# Patient Record
Sex: Male | Born: 1935 | Race: Asian | Hispanic: No | Marital: Married | Smoking: Former smoker
Health system: Southern US, Community
[De-identification: ages and names within clinical notes are randomized; demographics above are authoritative.]

## PROBLEM LIST (undated history)

## (undated) DIAGNOSIS — F0390 Unspecified dementia without behavioral disturbance: Secondary | ICD-10-CM

## (undated) DIAGNOSIS — E119 Type 2 diabetes mellitus without complications: Secondary | ICD-10-CM

## (undated) DIAGNOSIS — I10 Essential (primary) hypertension: Secondary | ICD-10-CM

## (undated) DIAGNOSIS — D320 Benign neoplasm of cerebral meninges: Secondary | ICD-10-CM

## (undated) DIAGNOSIS — E7800 Pure hypercholesterolemia, unspecified: Secondary | ICD-10-CM

## (undated) DIAGNOSIS — G960 Cerebrospinal fluid leak: Secondary | ICD-10-CM

## (undated) DIAGNOSIS — D329 Benign neoplasm of meninges, unspecified: Secondary | ICD-10-CM

## (undated) DIAGNOSIS — G9608 Other cranial cerebrospinal fluid leak: Secondary | ICD-10-CM

## (undated) HISTORY — DX: Benign neoplasm of meninges, unspecified: D32.9

## (undated) HISTORY — PX: GASTROSTOMY TUBE PLACEMENT: SHX655

## (undated) HISTORY — PX: APPENDECTOMY: SHX54

---

## 2012-08-25 ENCOUNTER — Ambulatory Visit
Admission: RE | Admit: 2012-08-25 | Discharge: 2012-08-25 | Disposition: A | Discharge status: Home / Self Care | Payer: Medicare Other | Source: Ambulatory Visit | Attending: Internal Medicine | Admitting: Internal Medicine

## 2012-08-25 ENCOUNTER — Other Ambulatory Visit: Payer: Self-pay | Admitting: Internal Medicine

## 2012-08-25 DIAGNOSIS — R29898 Other symptoms and signs involving the musculoskeletal system: Secondary | ICD-10-CM

## 2012-08-25 DIAGNOSIS — R413 Other amnesia: Secondary | ICD-10-CM

## 2012-08-25 MED ORDER — IOHEXOL 300 MG/ML  SOLN
75.0000 mL | Freq: Once | INTRAMUSCULAR | Status: AC | PRN
  Administered 2012-08-25: 75 mL via INTRAVENOUS

## 2012-08-26 ENCOUNTER — Inpatient Hospital Stay (HOSPITAL_COMMUNITY): Payer: Medicare Other

## 2012-08-26 ENCOUNTER — Inpatient Hospital Stay (HOSPITAL_COMMUNITY)
Admission: EM | Admit: 2012-08-26 | Discharge: 2012-09-03 | DRG: 025 | Disposition: A | Discharge status: Home / Self Care | Payer: Medicare Other | Attending: Neurosurgery | Admitting: Neurosurgery

## 2012-08-26 ENCOUNTER — Encounter (HOSPITAL_COMMUNITY): Payer: Self-pay | Admitting: Emergency Medicine

## 2012-08-26 DIAGNOSIS — D496 Neoplasm of unspecified behavior of brain: Secondary | ICD-10-CM

## 2012-08-26 DIAGNOSIS — D320 Benign neoplasm of cerebral meninges: Principal | ICD-10-CM | POA: Diagnosis present

## 2012-08-26 DIAGNOSIS — R4701 Aphasia: Secondary | ICD-10-CM

## 2012-08-26 DIAGNOSIS — G936 Cerebral edema: Secondary | ICD-10-CM | POA: Diagnosis present

## 2012-08-26 DIAGNOSIS — R531 Weakness: Secondary | ICD-10-CM

## 2012-08-26 DIAGNOSIS — I10 Essential (primary) hypertension: Secondary | ICD-10-CM | POA: Diagnosis present

## 2012-08-26 DIAGNOSIS — R42 Dizziness and giddiness: Secondary | ICD-10-CM

## 2012-08-26 HISTORY — DX: Essential (primary) hypertension: I10

## 2012-08-26 LAB — COMPREHENSIVE METABOLIC PANEL
BUN: 12 mg/dL (ref 6–23)
CO2: 23 mEq/L (ref 19–32)
Chloride: 102 mEq/L (ref 96–112)
Creatinine, Ser: 0.69 mg/dL (ref 0.50–1.35)
GFR calc Af Amer: >90 mL/min (ref >90)
GFR calc non Af Amer: 90 mL/min — ABNORMAL LOW (ref >90)
Glucose, Bld: 143 mg/dL — ABNORMAL HIGH (ref 70–99)
Total Bilirubin: 0.5 mg/dL (ref 0.3–1.2)

## 2012-08-26 LAB — DIFFERENTIAL
Lymphocytes Relative: 10 % — ABNORMAL LOW (ref 12–46)
Lymphs Abs: 0.7 10*3/uL (ref 0.7–4.0)
Neutrophils Relative %: 89 % — ABNORMAL HIGH (ref 43–77)

## 2012-08-26 LAB — APTT: aPTT: 35 seconds (ref 24–37)

## 2012-08-26 LAB — URINALYSIS, ROUTINE W REFLEX MICROSCOPIC
Bilirubin Urine: NEGATIVE
Glucose, UA: NEGATIVE mg/dL
Hgb urine dipstick: NEGATIVE
Ketones, ur: NEGATIVE mg/dL
Leukocytes, UA: NEGATIVE
pH: 7.5 (ref 5.0–8.0)

## 2012-08-26 LAB — CBC WITH DIFFERENTIAL/PLATELET
Basophils Absolute: 0 10*3/uL (ref 0.0–0.1)
Eosinophils Relative: 2 % (ref 0–5)
HCT: 39.1 % (ref 39.0–52.0)
Lymphocytes Relative: 27 % (ref 12–46)
Lymphs Abs: 1.4 10*3/uL (ref 0.7–4.0)
MCV: 89.5 fL (ref 78.0–100.0)
Monocytes Absolute: 0.3 10*3/uL (ref 0.1–1.0)
RDW: 13.1 % (ref 11.5–15.5)
WBC: 5 10*3/uL (ref 4.0–10.5)

## 2012-08-26 LAB — BASIC METABOLIC PANEL
BUN: 13 mg/dL (ref 6–23)
CO2: 27 mEq/L (ref 19–32)
Chloride: 102 mEq/L (ref 96–112)
Creatinine, Ser: 0.71 mg/dL (ref 0.50–1.35)

## 2012-08-26 LAB — CBC
Platelets: 270 10*3/uL (ref 150–400)
RBC: 4.42 MIL/uL (ref 4.22–5.81)
WBC: 6.6 10*3/uL (ref 4.0–10.5)

## 2012-08-26 LAB — PROTIME-INR: Prothrombin Time: 12.8 seconds (ref 11.6–15.2)

## 2012-08-26 MED ORDER — AMLODIPINE BESYLATE 10 MG PO TABS
10.0000 mg | ORAL_TABLET | Freq: Every day | ORAL | Status: DC
  Administered 2012-08-26 – 2012-09-02 (×6): 10 mg via ORAL
  Filled 2012-08-26 (×9): qty 1

## 2012-08-26 MED ORDER — SODIUM CHLORIDE 0.9 % IV SOLN
INTRAVENOUS | Status: DC
  Administered 2012-08-26: 13:00:00 via INTRAVENOUS

## 2012-08-26 MED ORDER — MORPHINE SULFATE 2 MG/ML IJ SOLN
2.0000 mg | INTRAMUSCULAR | Status: DC | PRN
  Filled 2012-08-26: qty 1

## 2012-08-26 MED ORDER — DEXAMETHASONE 4 MG PO TABS
4.0000 mg | ORAL_TABLET | Freq: Four times a day (QID) | ORAL | Status: DC
  Administered 2012-08-26 – 2012-08-27 (×6): 4 mg via ORAL
  Filled 2012-08-26 (×12): qty 1

## 2012-08-26 MED ORDER — LEVETIRACETAM 500 MG PO TABS
500.0000 mg | ORAL_TABLET | Freq: Two times a day (BID) | ORAL | Status: DC
  Administered 2012-08-26 – 2012-08-30 (×9): 500 mg via ORAL
  Filled 2012-08-26 (×11): qty 1

## 2012-08-26 MED ORDER — ACETAMINOPHEN 325 MG PO TABS: 650.0000 mg | ORAL_TABLET | Freq: Four times a day (QID) | ORAL | Status: DC | PRN

## 2012-08-26 MED ORDER — DEXAMETHASONE SODIUM PHOSPHATE 10 MG/ML IJ SOLN
10.0000 mg | Freq: Once | INTRAMUSCULAR | Status: AC
  Administered 2012-08-26: 10 mg via INTRAVENOUS
  Filled 2012-08-26: qty 1

## 2012-08-26 MED ORDER — POTASSIUM CHLORIDE IN NACL 20-0.9 MEQ/L-% IV SOLN
INTRAVENOUS | Status: DC
  Administered 2012-08-26 – 2012-08-27 (×3): via INTRAVENOUS
  Filled 2012-08-26 (×4): qty 1000

## 2012-08-26 MED ORDER — HYDROCODONE-ACETAMINOPHEN 5-325 MG PO TABS
1.0000 | ORAL_TABLET | ORAL | Status: DC | PRN
  Administered 2012-08-29: 2 via ORAL
  Administered 2012-08-31: 1 via ORAL
  Filled 2012-08-26 (×2): qty 1
  Filled 2012-08-26: qty 2

## 2012-08-26 MED ORDER — LISINOPRIL 40 MG PO TABS
40.0000 mg | ORAL_TABLET | Freq: Every day | ORAL | Status: DC
  Administered 2012-08-26 – 2012-09-02 (×6): 40 mg via ORAL
  Filled 2012-08-26 (×9): qty 1

## 2012-08-26 MED ORDER — GADOBENATE DIMEGLUMINE 529 MG/ML IV SOLN
15.0000 mL | Freq: Once | INTRAVENOUS | Status: AC
  Administered 2012-08-26: 15 mL via INTRAVENOUS

## 2012-08-26 MED ORDER — ACETAMINOPHEN 650 MG RE SUPP: 650.0000 mg | Freq: Four times a day (QID) | RECTAL | Status: DC | PRN

## 2012-08-26 NOTE — ED Notes (Signed)
Confirmed current medications with pastor/translator at bedside. Patient unsure of when the last dose taken.

## 2012-08-26 NOTE — ED Notes (Signed)
Patient in room A 3 with pastor/translator (korean), patient states left sided weakness for approx 2months. NAD. Patient able to respond to translator well

## 2012-08-26 NOTE — ED Notes (Signed)
Dizziness x 2 months and facial  droop left sided weakness

## 2012-08-26 NOTE — ED Notes (Signed)
Was sent to dr and had ct scan yesterday they found tumor

## 2012-08-26 NOTE — H&P (Signed)
William [name] is an 78 y.o. male.   Chief Complaint: Brain tumor HPI: 78 year old gentleman, was in his usual state of health until approximately 2 months ago when the family reports he has been dizzy and weak on his left side. He had a CT yesterday which revealed a large mass originating in the right middle fossa with surrounding edema and right to left shift. He was instructed by his primary physician to come to the emergency room for admission. He nor his wife speak english. I spoke to both his son and pastor and they acted as interpreters. The son wished to tell his father about the mass.   Past Medical History  Diagnosis Date  . Hypertension     No past surgical history on file.  No family history on file. Social History:  reports that he has never smoked. He does not have any smokeless tobacco history on file. He reports that he does not drink alcohol. His drug history not on file.  Allergies: No Known Allergies   (Not in a hospital admission)  Results for orders placed during the hospital encounter of 08/26/12 (from the past 48 hour(s))  BASIC METABOLIC PANEL     Status: Abnormal   Collection Time   08/26/12  1:03 PM      Component Value Range Comment   Sodium 137  135 - 145 mEq/L    Potassium 4.1  3.5 - 5.1 mEq/L    Chloride 102  96 - 112 mEq/L    CO2 27  19 - 32 mEq/L    Glucose, Bld 113 (*) 70 - 99 mg/dL    BUN 13  6 - 23 mg/dL    Creatinine, Ser 1.61  0.50 - 1.35 mg/dL    Calcium 09.6  8.4 - 10.5 mg/dL    GFR calc non Af Amer 89 (*) >90 mL/min    GFR calc Af Amer >90  >90 mL/min   CBC WITH DIFFERENTIAL     Status: Normal   Collection Time   08/26/12  1:03 PM      Component Value Range Comment   WBC 5.0  4.0 - 10.5 K/uL    RBC 4.37  4.22 - 5.81 MIL/uL    Hemoglobin 13.7  13.0 - 17.0 g/dL    HCT 04.5  40.9 - 81.1 %    MCV 89.5  78.0 - 100.0 fL    MCH 31.4  26.0 - 34.0 pg    MCHC 35.0  30.0 - 36.0 g/dL    RDW 91.4  78.2 - 95.6 %    Platelets 250  150 - 400 K/uL    Neutrophils Relative 66  43 - 77 %    Neutro Abs 3.3  1.7 - 7.7 K/uL    Lymphocytes Relative 27  12 - 46 %    Lymphs Abs 1.4  0.7 - 4.0 K/uL    Monocytes Relative 5  3 - 12 %    Monocytes Absolute 0.3  0.1 - 1.0 K/uL    Eosinophils Relative 2  0 - 5 %    Eosinophils Absolute 0.1  0.0 - 0.7 K/uL    Basophils Relative 0  0 - 1 %    Basophils Absolute 0.0  0.0 - 0.1 K/uL    Ct Head W Wo Contrast  08/25/2012  *RADIOLOGY REPORT*  Clinical Data: Left-sided weakness with facial droop and tremor. Vertigo.  CT HEAD WITHOUT AND WITH CONTRAST  Technique:  Contiguous axial images were  obtained from the base of the skull through the vertex without and with intravenous contrast.  Contrast: 75mL OMNIPAQUE IOHEXOL 300 MG/ML  SOLN  Comparison: None.  Findings: There is a lobulated 44 x 52 mm intra-axial lesion involving the right temporal lobe spreading to the uncinate fasciculus into the inferior right frontal lobe and medially towards the insular cortex. The lesion is roughly isodense with cerebral cortex precontrast and displays mild post contrast enhancement without significant central necrosis.  There is a significant amount of surrounding edema which affects the basal ganglia, internal capsule, and extends posteriorly toward the parietal lobe.  There is early right uncal herniation and moderate to right-to-left shift of 6 mm.  The right lateral ventricle is effaced.  No other similar lesions are seen.  Calvarium is intact. Moderate vascular calcification is noted.  The brain appears otherwise normal.  Sinuses and mastoids are clear.  IMPRESSION: 44 x 52 mm solitary right temporal intra-axial lesion displaying moderate post contrast enhancement. 6 mm right-to-left shift with early uncal herniation.  Concern is raised for primary brain tumor although a solitary metastasis is not excluded.  Neurosurgical consultation is warranted.   Original Report Authenticated By: Elsie Stain, M.D.     Review of Systems    Eyes: Negative.   Respiratory: Negative.   Cardiovascular: Negative.   Gastrointestinal: Negative.   Genitourinary: Negative.   Musculoskeletal: Negative.   Skin: Negative.   Neurological: Positive for weakness and headaches.       Forgetful  Endo/Heme/Allergies: Negative.   Psychiatric/Behavioral: Negative.     Blood pressure 129/58, pulse 60, temperature 98 F (36.7 C), temperature source Oral, resp. rate 14, SpO2 99.00%. Physical Exam  Constitutional: He is oriented to person, place, and time. He appears well-developed and well-nourished.  HENT:  Head: Normocephalic and atraumatic.  Right Ear: External ear normal.  Left Ear: External ear normal.  Nose: Nose normal.  Mouth/Throat: Oropharynx is clear and moist.  Eyes: Conjunctivae normal and EOM are normal. Pupils are equal, round, and reactive to light.  Neck: Normal range of motion. Neck supple.  Cardiovascular: Normal rate, regular rhythm, normal heart sounds and intact distal pulses.   Respiratory: Effort normal and breath sounds normal.  GI: Soft. Bowel sounds are normal.  Musculoskeletal: Normal range of motion.  Neurological: He is alert and oriented to person, place, and time. He has normal reflexes. He is not disoriented. He displays normal reflexes. No cranial nerve deficit or sensory deficit. He exhibits normal muscle tone. He displays no seizure activity.       Reports left sided weakness, appears to have full strength on manual exam  Skin: Skin is warm and dry.  Psychiatric: He has a normal mood and affect. His behavior is normal.     Assessment/Plan 78 yo with a large right temporal mass causing mass effect. He is to be admitted for eventual operative resection. He will receive decadron and undergo an MRI. He will be admitted to the NICU.  Actually his exam is quite good. Though the family reports he has been weak on the left side his exam is very good.   [name] 08/26/2012, 2:03 PM

## 2012-08-26 NOTE — ED Notes (Signed)
MD at bedside. Neurosurgery

## 2012-08-26 NOTE — ED Provider Notes (Signed)
History     CSN: [id number]  Arrival date & time 08/26/12  1112   First MD Initiated Contact with Patient 08/26/12 1238      No chief complaint on file.   (Consider location/radiation/quality/duration/timing/severity/associated sxs/prior treatment) HPI Comments: William [name] is a 78 y.o. Male with dizziness, and confusion, worsening for 2 months. Now weakness of left arm and tremor for 2 days.No N/V, fever or chills. His PCP ordered the CT, and he was told to come last night. No modifying factors.  The history is provided by the patient. A language interpreter was used (The patient's pastor).    Past Medical History  Diagnosis Date  . Hypertension     No past surgical history on file.  No family history on file.  History  Substance Use Topics  . Smoking status: Never Smoker   . Smokeless tobacco: Not on file  . Alcohol Use: No      Review of Systems  All other systems reviewed and are negative.    Allergies  Review of patient's allergies indicates no known allergies.  Home Medications  No current outpatient prescriptions on file.  BP 106/55  Pulse 57  Temp 97.7 F (36.5 C) (Oral)  Resp 20  Ht 5\' 2"  (1.575 m)  Wt 139 lb 8.8 oz (63.3 kg)  BMI 25.52 kg/m2  SpO2 95%  Physical Exam  Nursing note and vitals reviewed. Constitutional: He is oriented to person, place, and time. He appears well-developed and well-nourished.  HENT:  Head: Normocephalic and atraumatic.  Right Ear: External ear normal.  Left Ear: External ear normal.  Eyes: Conjunctivae normal and EOM are normal. Pupils are equal, round, and reactive to light.  Neck: Normal range of motion and phonation normal. Neck supple.  Cardiovascular: Normal rate, regular rhythm, normal heart sounds and intact distal pulses.   Pulmonary/Chest: Effort normal and breath sounds normal. He exhibits no bony tenderness.  Abdominal: Soft. Normal appearance. There is no tenderness.  Musculoskeletal: Normal range of  motion.  Neurological: He is alert and oriented to person, place, and time. He has normal strength. No cranial nerve deficit or sensory deficit. He exhibits normal muscle tone. Coordination (abnormal finger to nose, R>L. ) abnormal.       Normal grip strength arms and legs bilaterally. No facial asymmetry.  Skin: Skin is warm, dry and intact.  Psychiatric: He has a normal mood and affect. His behavior is normal. Judgment and thought content normal.    ED Course  Procedures (including critical care time) ED Treatment by me- IV Decadron  I called Dr Jeral Fruit, he did not answer; Dr Mikal Plane came to the ED to see and admit the patient.    Labs Reviewed  BASIC METABOLIC PANEL - Abnormal; Notable for the following:    Glucose, Bld 113 (*)     GFR calc non Af Amer 89 (*)     All other components within normal limits  COMPREHENSIVE METABOLIC PANEL - Abnormal; Notable for the following:    Glucose, Bld 143 (*)     GFR calc non Af Amer 90 (*)     All other components within normal limits  DIFFERENTIAL - Abnormal; Notable for the following:    Neutrophils Relative 89 (*)     Lymphocytes Relative 10 (*)     Monocytes Relative 1 (*)     Monocytes Absolute 0.0 (*)     All other components within normal limits  URINALYSIS, ROUTINE W REFLEX MICROSCOPIC  CBC WITH  DIFFERENTIAL  MRSA PCR SCREENING  CBC  APTT  PROTIME-INR   X-ray Chest Pa And Lateral   08/26/2012  *RADIOLOGY REPORT*  Clinical Data: Preoperative chest radiograph.  CHEST - 2 VIEW  Comparison: No priors.  Findings: Lung volumes are normal.  No consolidative airspace disease.  No pleural effusions.  No pneumothorax.  No pulmonary nodule or mass noted.  Pulmonary vasculature and the cardiomediastinal silhouette are within normal limits. Atherosclerosis in the thoracic aorta.  Prominence of the right first costochondral junction may relate to remote trauma or may be degenerative.  IMPRESSION: 1.  No radiographic evidence of acute  cardiopulmonary disease. 2.  Atherosclerosis.   Original Report Authenticated By: Florencia Reasons, M.D.    Ct Head W Wo Contrast  08/25/2012  *RADIOLOGY REPORT*  Clinical Data: Left-sided weakness with facial droop and tremor. Vertigo.  CT HEAD WITHOUT AND WITH CONTRAST  Technique:  Contiguous axial images were obtained from the base of the skull through the vertex without and with intravenous contrast.  Contrast: 75mL OMNIPAQUE IOHEXOL 300 MG/ML  SOLN  Comparison: None.  Findings: There is a lobulated 44 x 52 mm intra-axial lesion involving the right temporal lobe spreading to the uncinate fasciculus into the inferior right frontal lobe and medially towards the insular cortex. The lesion is roughly isodense with cerebral cortex precontrast and displays mild post contrast enhancement without significant central necrosis.  There is a significant amount of surrounding edema which affects the basal ganglia, internal capsule, and extends posteriorly toward the parietal lobe.  There is early right uncal herniation and moderate to right-to-left shift of 6 mm.  The right lateral ventricle is effaced.  No other similar lesions are seen.  Calvarium is intact. Moderate vascular calcification is noted.  The brain appears otherwise normal.  Sinuses and mastoids are clear.  IMPRESSION: 44 x 52 mm solitary right temporal intra-axial lesion displaying moderate post contrast enhancement. 6 mm right-to-left shift with early uncal herniation.  Concern is raised for primary brain tumor although a solitary metastasis is not excluded.  Neurosurgical consultation is warranted.   Original Report Authenticated By: Elsie Stain, M.D.    Mr Laqueta Jean Wo Contrast  08/26/2012  *RADIOLOGY REPORT*  Clinical Data: Left-sided weakness and dizziness.  Mass lesion.  MRI HEAD WITHOUT AND WITH CONTRAST  Technique:  Multiplanar, multiecho pulse sequences of the brain and surrounding structures were obtained according to standard protocol  without and with intravenous contrast  Contrast: 15mL MULTIHANCE GADOBENATE DIMEGLUMINE 529 MG/ML IV SOLN  Comparison: CT head with and without contrast 08/25/2012.  Findings: A 4.4 x 7.0 x 4.7 cm right temporal fossa mass demonstrates a CSF cleft around the tumor, compatible with an extra- axial tumor.  There is also restricted effusion in the tumor, compatible with a meningioma.  Enhancement is mildly heterogeneous. The tumor extends to the lateral margin of the cavernous sinus without invasion of the cavernous sinus or orbital apex.  There is marked displacement of the right temporal lobe inferior frontal lobe with significant surrounding vasogenic edema.  Midline shift of the mamillary bodies is 11 mm.  There is effacement of the right lateral ventricle.  There is no hydrocephalus.  Mild periventricular white matter changes are present.  Sulcal effacement is noted throughout the right hemisphere.  No other focal mass lesion is present.  There is no hemorrhage.  Flow is present in the major intracranial arteries.  The right middle cerebral artery extends around the tumor.  The patient is  status post bilateral lens extractions.  The paranasal sinuses and mastoid air cells are clear.  IMPRESSION:  1.  4.4 x 7.0 x 4.7 cm right temporal fossa mass appears to be extra-axial and is most compatible with a meningioma. 2.  Significant right-sided mass effect with sulcal effacement and extensive vasogenic edema. 3.  11 mm of midline shift.   Original Report Authenticated By: Jamesetta Orleans. MATTERN, M.D.      1. Brain tumor       MDM  Symptomatic Brain tumor. Pt needs urgent Neurosurgical consultation.   Disposition per Dr Mikal Plane     Flint Melter, MD 08/26/12 2124

## 2012-08-26 NOTE — Progress Notes (Signed)
Mri reviewed. This is almost certainly a meningioma. Spoke to family tonight. Will arrange a surgical date, and preoperative embolization.

## 2012-08-27 ENCOUNTER — Other Ambulatory Visit: Payer: Self-pay | Admitting: Neurosurgery

## 2012-08-27 ENCOUNTER — Telehealth (HOSPITAL_COMMUNITY): Payer: Self-pay

## 2012-08-27 NOTE — Progress Notes (Signed)
Patient ID: William [name], male   DOB: June 12, 1934, 78 y.o.   MRN: [id number] BP 114/56  Pulse 58  Temp 98.6 F (37 C) (Oral)  Resp 17  Ht 5\' 2"  (1.575 m)  Wt 63.3 kg (139 lb 8.8 oz)  BMI 25.52 kg/m2  SpO2 96% Alert and oriented Follows commands Moving all extremities Case scheduled for Monday, after embolization.

## 2012-08-27 NOTE — Telephone Encounter (Signed)
Dr. Corliss Skains wants the priest and the son to be here Monday for the procedure

## 2012-08-28 MED ORDER — SODIUM CHLORIDE 0.9 % IV SOLN: INTRAVENOUS | Status: DC

## 2012-08-28 MED ORDER — POTASSIUM CHLORIDE IN NACL 20-0.9 MEQ/L-% IV SOLN
INTRAVENOUS | Status: DC
  Administered 2012-08-28 – 2012-08-30 (×2): 20 mL/h via INTRAVENOUS
  Filled 2012-08-28 (×2): qty 1000

## 2012-08-28 MED ORDER — NIMODIPINE 30 MG PO CAPS
60.0000 mg | ORAL_CAPSULE | ORAL | Status: DC
  Filled 2012-08-28: qty 2

## 2012-08-28 MED ORDER — DEXAMETHASONE 4 MG PO TABS
4.0000 mg | ORAL_TABLET | Freq: Three times a day (TID) | ORAL | Status: DC
  Administered 2012-08-28 – 2012-08-31 (×10): 4 mg via ORAL
  Filled 2012-08-28 (×13): qty 1

## 2012-08-28 MED ORDER — CEFAZOLIN SODIUM-DEXTROSE 2-3 GM-% IV SOLR
2.0000 g | Freq: Once | INTRAVENOUS | Status: AC
  Administered 2012-08-31: 2 g via INTRAVENOUS
  Administered 2012-08-31: 1 g via INTRAVENOUS
  Filled 2012-08-28: qty 50

## 2012-08-28 NOTE — Clinical Documentation Improvement (Signed)
Abnormal Results Clarification  THIS DOCUMENT IS NOT A PERMANENT PART OF THE MEDICAL RECORD  Please update your documentation within the medical record to reflect your response to this query.                                                                                   08/28/12  Dear Dr. Franky Macho Marton Redwood  In a better effort to capture your patient's severity of illness, reflect appropriate length of stay and utilization of resources, a review of the medical record has revealed the following indicators.   Based on your clinical judgment, please clarify and document in a progress note and/or discharge summary the clinical condition associated with the following supporting information: In responding to this query please exercise your independent judgment.  The fact that a query is asked, does not imply that any particular answer is desired or expected.    Abnormal findings (laboratory, x-ray, pathologic, and other diagnostic results) are not coded and reported unless the physician indicates their clinical significance.   The medical record reflects the following clinical findings, please clarify the diagnostic and/or clinical significance:      MRI Brain 10/09 :1. 4.4 x 7.0 x 4.7 cm right temporal fossa mass appears to be  extra-axial and is most compatible with a meningioma.  2. Significant right-sided mass effect with sulcal effacement and  extensive vasogenic edema.  3. 11 mm of midline shift.  Possible Clinical Conditions?  - Vasogenic Edema  - Cerebral Edema  - Other condition (please document in the progress notes and/or discharge summary)  - Cannot Clinically determine at this time    Supporting Information:  - dexamethasone (DECADRON) tablet 4 mg ordered 08/28/12     Reviewed: additional documentation in the medical record   Thank Darden Palmer  Clinical Documentation Specialist: [phone] Pager  Health Information Management [facility]

## 2012-08-28 NOTE — Progress Notes (Signed)
Subjective: Interval History: has complaints left sided weakness.  Objective: Vital signs in last 24 hours: Temp:  [97.5 F (36.4 C)-98 F (36.7 C)] 97.6 F (36.4 C) (10/11 1345) Pulse Rate:  [49-61] 61  (10/11 1345) Resp:  [12-21] 18  (10/11 1345) BP: (95-122)/(50-60) 115/54 mmHg (10/11 1345) SpO2:  [95 %-98 %] 98 % (10/11 1345) Weight:  [69.2 kg (152 lb 8.9 oz)] 69.2 kg (152 lb 8.9 oz) (10/11 0541)  Intake/Output from previous day: 10/10 0701 - 10/11 0700 In: 1200 [I.V.:1200] Out: 300 [Urine:300] Intake/Output this shift: Total I/O In: -  Out: 250 [Urine:250]  Alert and oriented, follows commands Perrl, full eom Symmetric facies, tongue and uvula midline Moving all extremities  No results found for this or any previous visit (from the past 24 hour(s)).  Studies/Results: X-ray Chest Pa And Lateral   08/26/2012  *RADIOLOGY REPORT*  Clinical Data: Preoperative chest radiograph.  CHEST - 2 VIEW  Comparison: No priors.  Findings: Lung volumes are normal.  No consolidative airspace disease.  No pleural effusions.  No pneumothorax.  No pulmonary nodule or mass noted.  Pulmonary vasculature and the cardiomediastinal silhouette are within normal limits. Atherosclerosis in the thoracic aorta.  Prominence of the right first costochondral junction may relate to remote trauma or may be degenerative.  IMPRESSION: 1.  No radiographic evidence of acute cardiopulmonary disease. 2.  Atherosclerosis.   Original Report Authenticated By: Florencia Reasons, M.D.    Ct Head W Wo Contrast  08/25/2012  *RADIOLOGY REPORT*  Clinical Data: Left-sided weakness with facial droop and tremor. Vertigo.  CT HEAD WITHOUT AND WITH CONTRAST  Technique:  Contiguous axial images were obtained from the base of the skull through the vertex without and with intravenous contrast.  Contrast: 75mL OMNIPAQUE IOHEXOL 300 MG/ML  SOLN  Comparison: None.  Findings: There is a lobulated 44 x 52 mm intra-axial lesion involving  the right temporal lobe spreading to the uncinate fasciculus into the inferior right frontal lobe and medially towards the insular cortex. The lesion is roughly isodense with cerebral cortex precontrast and displays mild post contrast enhancement without significant central necrosis.  There is a significant amount of surrounding edema which affects the basal ganglia, internal capsule, and extends posteriorly toward the parietal lobe.  There is early right uncal herniation and moderate to right-to-left shift of 6 mm.  The right lateral ventricle is effaced.  No other similar lesions are seen.  Calvarium is intact. Moderate vascular calcification is noted.  The brain appears otherwise normal.  Sinuses and mastoids are clear.  IMPRESSION: 44 x 52 mm solitary right temporal intra-axial lesion displaying moderate post contrast enhancement. 6 mm right-to-left shift with early uncal herniation.  Concern is raised for primary brain tumor although a solitary metastasis is not excluded.  Neurosurgical consultation is warranted.   Original Report Authenticated By: Elsie Stain, M.D.    Mr Laqueta Jean Wo Contrast  08/26/2012  *RADIOLOGY REPORT*  Clinical Data: Left-sided weakness and dizziness.  Mass lesion.  MRI HEAD WITHOUT AND WITH CONTRAST  Technique:  Multiplanar, multiecho pulse sequences of the brain and surrounding structures were obtained according to standard protocol without and with intravenous contrast  Contrast: 15mL MULTIHANCE GADOBENATE DIMEGLUMINE 529 MG/ML IV SOLN  Comparison: CT head with and without contrast 08/25/2012.  Findings: A 4.4 x 7.0 x 4.7 cm right temporal fossa mass demonstrates a CSF cleft around the tumor, compatible with an extra- axial tumor.  There is also restricted effusion in the  tumor, compatible with a meningioma.  Enhancement is mildly heterogeneous. The tumor extends to the lateral margin of the cavernous sinus without invasion of the cavernous sinus or orbital apex.  There is marked  displacement of the right temporal lobe inferior frontal lobe with significant surrounding vasogenic edema.  Midline shift of the mamillary bodies is 11 mm.  There is effacement of the right lateral ventricle.  There is no hydrocephalus.  Mild periventricular white matter changes are present.  Sulcal effacement is noted throughout the right hemisphere.  No other focal mass lesion is present.  There is no hemorrhage.  Flow is present in the major intracranial arteries.  The right middle cerebral artery extends around the tumor.  The patient is status post bilateral lens extractions.  The paranasal sinuses and mastoid air cells are clear.  IMPRESSION:  1.  4.4 x 7.0 x 4.7 cm right temporal fossa mass appears to be extra-axial and is most compatible with a meningioma. 2.  Significant right-sided mass effect with sulcal effacement and extensive vasogenic edema. 3.  11 mm of midline shift.   Original Report Authenticated By: Jamesetta Orleans. MATTERN, M.D.     Scheduled Meds:   . amLODipine  10 mg Oral Daily  .  ceFAZolin (ANCEF) IV  2 g Intravenous Once  . dexamethasone  4 mg Oral Q8H  . levETIRAcetam  500 mg Oral BID  . lisinopril  40 mg Oral Daily  . niMODipine  60 mg Oral 60 min Pre-Op  . DISCONTD: dexamethasone  4 mg Oral Q6H   Continuous Infusions:   . sodium chloride    . 0.9 % NaCl with KCl 20 mEq / L 20 mL/hr (08/28/12 1126)  . DISCONTD: 0.9 % NaCl with KCl 20 mEq / L 80 mL/hr at 08/27/12 1816   PRN Meds:acetaminophen, acetaminophen, HYDROcodone-acetaminophen, morphine injection  Assessment/Plan: For resection Monday. Continue decadron for cerebral edema.  Exam is unchanged.     LOS: 2 days   Verdine Grenfell L

## 2012-08-28 NOTE — Progress Notes (Signed)
Co-signed for Meredith Leonard RN/BSN for assessments, IV assessments, Medication administration, I and O's, Care plans, patient educations, and progress notes. Kayliana Codd M,RN/BSN 

## 2012-08-29 NOTE — Progress Notes (Signed)
Patient ID: William [name], male   DOB: Nov 07, 1934, 78 y.o.   MRN: [id number] Subjective:  The patient is alert and pleasant. There is a language barrier.  Objective: Vital signs in last 24 hours: Temp:  [97.4 F (36.3 C)-98.1 F (36.7 C)] 97.4 F (36.3 C) (10/12 0732) Pulse Rate:  [51-61] 58  (10/12 0732) Resp:  [18-20] 18  (10/12 0732) BP: (104-122)/(53-65) 117/58 mmHg (10/12 0732) SpO2:  [95 %-99 %] 95 % (10/12 0732)  Intake/Output from previous day: 10/11 0701 - 10/12 0700 In: -  Out: 250 [Urine:250] Intake/Output this shift:    Physical exam the patient is alert and moving all 4 extremities. His pupils are equal.  Lab Results:  Basename 08/26/12 1636 08/26/12 1303  WBC 6.6 5.0  HGB 13.8 13.7  HCT 39.8 39.1  PLT 270 250   BMET  Basename 08/26/12 1636 08/26/12 1303  NA 136 137  K 4.1 4.1  CL 102 102  CO2 23 27  GLUCOSE 143* 113*  BUN 12 13  CREATININE 0.69 0.71  CALCIUM 10.0 10.0    Studies/Results: No results found.  Assessment/Plan: Brain tumor: Surgery is tentatively plan for Monday.  LOS: 3 days     Janey Petron D 08/29/2012, 9:15 AM

## 2012-08-30 MED ORDER — CEFAZOLIN SODIUM-DEXTROSE 2-3 GM-% IV SOLR
2.0000 g | INTRAVENOUS | Status: DC
  Filled 2012-08-30: qty 50

## 2012-08-30 NOTE — Progress Notes (Signed)
BP 115/62  Pulse 53  Temp 98.1 F (36.7 C) (Oral)  Resp 20  Ht 5\' 2"  (1.575 m)  Wt 152 lb 8.9 oz (69.2 kg)  BMI 27.90 kg/m2  SpO2 98% Request for IR angiogram with embo prior to planned NS procedure Dr. Corliss Skains with planned case for tomorrow. We are awaiting son and 'priest' to interpret to thoroughly discuss case/procedure. Labs ok NPO after MN. Dr. Corliss Skains is available if son/priest are here today and would prefer to meet today as case is at 0800 tomorrow.

## 2012-08-30 NOTE — Progress Notes (Signed)
Subjective: Patient reports doing OK, although language barrier (Korean/English)  Objective: Vital signs in last 24 hours: Temp:  [97.9 F (36.6 C)-98.1 F (36.7 C)] 98.1 F (36.7 C) (10/13 0617) Pulse Rate:  [48-62] 53  (10/13 0617) Resp:  [18-20] 20  (10/13 0617) BP: (108-132)/(54-62) 115/62 mmHg (10/13 0617) SpO2:  [97 %-98 %] 98 % (10/13 0617)  Intake/Output from previous day: 10/12 0701 - 10/13 0700 In: -  Out: 1150 [Urine:1150] Intake/Output this shift:    Physical Exam: Stable.  Walking with wife  Lab Results: No results found for this basename: WBC:2,HGB:2,HCT:2,PLT:2 in the last 72 hours BMET No results found for this basename: NA:2,K:2,CL:2,CO2:2,GLUCOSE:2,BUN:2,CREATININE:2,CALCIUM:2 in the last 72 hours  Studies/Results: No results found.  Assessment/Plan: Surgery in am with Dr. Franky Macho for meningioma.    LOS: 4 days    Dorian Heckle, MD 08/30/2012, 8:49 AM

## 2012-08-31 ENCOUNTER — Encounter (HOSPITAL_COMMUNITY): Payer: Self-pay | Admitting: Certified Registered"

## 2012-08-31 ENCOUNTER — Encounter (HOSPITAL_COMMUNITY)
Admission: EM | Disposition: A | Discharge status: Home / Self Care | Payer: Self-pay | Source: Home / Self Care | Attending: Neurosurgery

## 2012-08-31 ENCOUNTER — Inpatient Hospital Stay (HOSPITAL_COMMUNITY): Payer: Medicare Other | Admitting: Certified Registered"

## 2012-08-31 ENCOUNTER — Inpatient Hospital Stay (HOSPITAL_COMMUNITY): Payer: Medicare Other

## 2012-08-31 HISTORY — PX: CRANIOTOMY: SHX93

## 2012-08-31 LAB — CBC
HCT: 39.2 % (ref 39.0–52.0)
Hemoglobin: 13.7 g/dL (ref 13.0–17.0)
MCHC: 34.9 g/dL (ref 30.0–36.0)

## 2012-08-31 LAB — BASIC METABOLIC PANEL
BUN: 24 mg/dL — ABNORMAL HIGH (ref 6–23)
CO2: 26 mEq/L (ref 19–32)
Chloride: 101 mEq/L (ref 96–112)
GFR calc non Af Amer: 86 mL/min — ABNORMAL LOW (ref >90)
Glucose, Bld: 117 mg/dL — ABNORMAL HIGH (ref 70–99)
Potassium: 4 mEq/L (ref 3.5–5.1)

## 2012-08-31 SURGERY — CRANIOTOMY TUMOR EXCISION
Anesthesia: General | Site: Head | Laterality: Right | Wound class: Clean

## 2012-08-31 SURGERY — RADIOLOGY WITH ANESTHESIA
Anesthesia: General

## 2012-08-31 MED ORDER — MANNITOL 25 % IV SOLN
INTRAVENOUS | Status: DC | PRN
  Administered 2012-08-31: 37.5 g via INTRAVENOUS

## 2012-08-31 MED ORDER — LACTATED RINGERS IV SOLN: INTRAVENOUS | Status: DC | PRN

## 2012-08-31 MED ORDER — SODIUM CHLORIDE 0.9 % IV SOLN
500.0000 mg | Freq: Two times a day (BID) | INTRAVENOUS | Status: DC
  Administered 2012-08-31 – 2012-09-02 (×4): 500 mg via INTRAVENOUS
  Filled 2012-08-31 (×5): qty 5

## 2012-08-31 MED ORDER — SODIUM CHLORIDE 0.9 % IV SOLN
INTRAVENOUS | Status: DC | PRN
  Administered 2012-08-31 (×2): via INTRAVENOUS

## 2012-08-31 MED ORDER — MIDAZOLAM HCL 5 MG/5ML IJ SOLN
INTRAMUSCULAR | Status: DC | PRN
  Administered 2012-08-31: 2 mg via INTRAVENOUS

## 2012-08-31 MED ORDER — ONDANSETRON HCL 4 MG PO TABS: 4.0000 mg | ORAL_TABLET | ORAL | Status: DC | PRN

## 2012-08-31 MED ORDER — BISACODYL 5 MG PO TBEC
5.0000 mg | DELAYED_RELEASE_TABLET | Freq: Every day | ORAL | Status: DC | PRN
  Filled 2012-08-31: qty 1

## 2012-08-31 MED ORDER — FENTANYL CITRATE 0.05 MG/ML IJ SOLN: 25.0000 ug | INTRAMUSCULAR | Status: DC | PRN

## 2012-08-31 MED ORDER — VECURONIUM BROMIDE 10 MG IV SOLR
INTRAVENOUS | Status: DC | PRN
  Administered 2012-08-31: 4 mg via INTRAVENOUS
  Administered 2012-08-31 (×3): 2 mg via INTRAVENOUS

## 2012-08-31 MED ORDER — MAGNESIUM CITRATE PO SOLN
1.0000 | Freq: Once | ORAL | Status: AC | PRN
  Filled 2012-08-31: qty 296

## 2012-08-31 MED ORDER — DEXAMETHASONE SODIUM PHOSPHATE 4 MG/ML IJ SOLN
INTRAMUSCULAR | Status: DC | PRN
  Administered 2012-08-31: 10 mg via INTRAVENOUS

## 2012-08-31 MED ORDER — HEPARIN SODIUM (PORCINE) 1000 UNIT/ML IJ SOLN
INTRAMUSCULAR | Status: DC | PRN
  Administered 2012-08-31 (×3): 1000 [IU] via INTRAVENOUS

## 2012-08-31 MED ORDER — MICROFIBRILLAR COLL HEMOSTAT EX PADS
MEDICATED_PAD | CUTANEOUS | Status: DC | PRN
  Administered 2012-08-31: 1 via TOPICAL

## 2012-08-31 MED ORDER — PROMETHAZINE HCL 25 MG PO TABS: 12.5000 mg | ORAL_TABLET | ORAL | Status: DC | PRN

## 2012-08-31 MED ORDER — GLYCOPYRROLATE 0.2 MG/ML IJ SOLN
INTRAMUSCULAR | Status: DC | PRN
  Administered 2012-08-31: 0.4 mg via INTRAVENOUS

## 2012-08-31 MED ORDER — NEOSTIGMINE METHYLSULFATE 1 MG/ML IJ SOLN
INTRAMUSCULAR | Status: DC | PRN
  Administered 2012-08-31: 3 mg via INTRAVENOUS

## 2012-08-31 MED ORDER — LIDOCAINE HCL (CARDIAC) 20 MG/ML IV SOLN
INTRAVENOUS | Status: DC | PRN
  Administered 2012-08-31: 60 mg via INTRAVENOUS

## 2012-08-31 MED ORDER — SODIUM CHLORIDE 0.9 % IJ SOLN
1.5000 mg | INTRAVENOUS | Status: DC
  Filled 2012-08-31: qty 0.3

## 2012-08-31 MED ORDER — ARTIFICIAL TEARS OP OINT
TOPICAL_OINTMENT | OPHTHALMIC | Status: DC | PRN
  Administered 2012-08-31: 1 via OPHTHALMIC

## 2012-08-31 MED ORDER — THROMBIN 20000 UNITS EX SOLR
CUTANEOUS | Status: DC | PRN
  Administered 2012-08-31: 15:00:00 via TOPICAL

## 2012-08-31 MED ORDER — MICROFIBRILLAR COLL HEMOSTAT EX PADS: MEDICATED_PAD | CUTANEOUS | Status: DC | PRN

## 2012-08-31 MED ORDER — POTASSIUM CHLORIDE IN NACL 20-0.9 MEQ/L-% IV SOLN
INTRAVENOUS | Status: DC
  Administered 2012-08-31 – 2012-09-02 (×3): via INTRAVENOUS
  Filled 2012-08-31 (×5): qty 1000

## 2012-08-31 MED ORDER — ONDANSETRON HCL 4 MG/2ML IJ SOLN: 4.0000 mg | INTRAMUSCULAR | Status: DC | PRN

## 2012-08-31 MED ORDER — SODIUM CHLORIDE 0.9 % IV SOLN
INTRAVENOUS | Status: DC | PRN
  Administered 2012-08-31 (×2): via INTRAVENOUS

## 2012-08-31 MED ORDER — LABETALOL HCL 5 MG/ML IV SOLN: 10.0000 mg | INTRAVENOUS | Status: DC | PRN

## 2012-08-31 MED ORDER — ACETAMINOPHEN 10 MG/ML IV SOLN
1000.0000 mg | Freq: Four times a day (QID) | INTRAVENOUS | Status: AC
  Administered 2012-08-31 – 2012-09-01 (×4): 1000 mg via INTRAVENOUS
  Filled 2012-08-31 (×4): qty 100

## 2012-08-31 MED ORDER — SODIUM CHLORIDE 0.9 % IR SOLN
Status: DC | PRN
  Administered 2012-08-31 (×3): 1000 mL

## 2012-08-31 MED ORDER — ONDANSETRON HCL 4 MG/2ML IJ SOLN
INTRAMUSCULAR | Status: DC | PRN
  Administered 2012-08-31: 4 mg via INTRAVENOUS

## 2012-08-31 MED ORDER — DEXAMETHASONE 6 MG PO TABS
6.0000 mg | ORAL_TABLET | Freq: Four times a day (QID) | ORAL | Status: DC
  Administered 2012-08-31 – 2012-09-03 (×10): 6 mg via ORAL
  Filled 2012-08-31 (×15): qty 1

## 2012-08-31 MED ORDER — ONDANSETRON HCL 4 MG/2ML IJ SOLN: 4.0000 mg | Freq: Four times a day (QID) | INTRAMUSCULAR | Status: DC | PRN

## 2012-08-31 MED ORDER — OXYCODONE HCL 5 MG PO TABS: 5.0000 mg | ORAL_TABLET | Freq: Once | ORAL | Status: DC | PRN

## 2012-08-31 MED ORDER — SODIUM CHLORIDE 0.9 % IV SOLN
10.0000 mg | INTRAVENOUS | Status: DC | PRN
  Administered 2012-08-31: 20 ug/min via INTRAVENOUS

## 2012-08-31 MED ORDER — IOHEXOL 300 MG/ML  SOLN
150.0000 mL | Freq: Once | INTRAMUSCULAR | Status: AC | PRN
  Administered 2012-08-31: 150 mL via INTRA_ARTERIAL

## 2012-08-31 MED ORDER — FENTANYL CITRATE 0.05 MG/ML IJ SOLN
INTRAMUSCULAR | Status: DC | PRN
  Administered 2012-08-31: 50 ug via INTRAVENOUS
  Administered 2012-08-31: 25 ug via INTRAVENOUS
  Administered 2012-08-31: 50 ug via INTRAVENOUS

## 2012-08-31 MED ORDER — OXYCODONE HCL 5 MG/5ML PO SOLN: 5.0000 mg | Freq: Once | ORAL | Status: DC | PRN

## 2012-08-31 MED ORDER — CEFAZOLIN SODIUM 1-5 GM-% IV SOLN
INTRAVENOUS | Status: AC
  Filled 2012-08-31: qty 50

## 2012-08-31 MED ORDER — PROPOFOL 10 MG/ML IV BOLUS
INTRAVENOUS | Status: DC | PRN
  Administered 2012-08-31: 50 mg via INTRAVENOUS
  Administered 2012-08-31: 160 mg via INTRAVENOUS

## 2012-08-31 MED ORDER — PANTOPRAZOLE SODIUM 40 MG IV SOLR
40.0000 mg | Freq: Every day | INTRAVENOUS | Status: DC
  Administered 2012-08-31 – 2012-09-01 (×2): 40 mg via INTRAVENOUS
  Filled 2012-08-31 (×3): qty 40

## 2012-08-31 MED ORDER — ROCURONIUM BROMIDE 100 MG/10ML IV SOLN
INTRAVENOUS | Status: DC | PRN
  Administered 2012-08-31 (×4): 10 mg via INTRAVENOUS
  Administered 2012-08-31: 50 mg via INTRAVENOUS

## 2012-08-31 MED ORDER — BACITRACIN ZINC 500 UNIT/GM EX OINT
TOPICAL_OINTMENT | CUTANEOUS | Status: DC | PRN
  Administered 2012-08-31: 1 via TOPICAL

## 2012-08-31 MED ORDER — EPHEDRINE SULFATE 50 MG/ML IJ SOLN
INTRAMUSCULAR | Status: DC | PRN
  Administered 2012-08-31 (×2): 5 mg via INTRAVENOUS

## 2012-08-31 MED ORDER — LIDOCAINE-EPINEPHRINE 0.5 %-1:200000 IJ SOLN
INTRAMUSCULAR | Status: DC | PRN
  Administered 2012-08-31: 10 mL

## 2012-08-31 SURGICAL SUPPLY — 114 items
BANDAGE GAUZE 4  KLING STR (GAUZE/BANDAGES/DRESSINGS) ×2 IMPLANT
BANDAGE GAUZE ELAST BULKY 4 IN (GAUZE/BANDAGES/DRESSINGS) ×6 IMPLANT
BENZOIN TINCTURE PRP APPL 2/3 (GAUZE/BANDAGES/DRESSINGS) IMPLANT
BLADE SAW GIGLI 16 STRL (MISCELLANEOUS) IMPLANT
BLADE SURG 15 STRL LF DISP TIS (BLADE) IMPLANT
BLADE SURG 15 STRL SS (BLADE)
BLADE SURG ROTATE 9660 (MISCELLANEOUS) ×2 IMPLANT
BLADE ULTRA TIP 2M (BLADE) IMPLANT
BRUSH SCRUB EZ 1% IODOPHOR (MISCELLANEOUS) IMPLANT
BUR ACORN 6.0 PRECISION (BURR) ×2 IMPLANT
BUR ADDG 1.1 (BURR) ×2 IMPLANT
BUR MATCHSTICK NEURO 3.0 LAGG (BURR) ×2 IMPLANT
BUR ROUTER D-58 CRANI (BURR) ×2 IMPLANT
CANISTER SUCTION 2500CC (MISCELLANEOUS) ×4 IMPLANT
CATH VENTRIC 35X38 W/TROCAR LG (CATHETERS) IMPLANT
CLIP RANEY DISP (INSTRUMENTS) ×2 IMPLANT
CLIP TI MEDIUM 6 (CLIP) IMPLANT
CLOTH BEACON ORANGE TIMEOUT ST (SAFETY) ×2 IMPLANT
CONT SPEC 4OZ CLIKSEAL STRL BL (MISCELLANEOUS) ×8 IMPLANT
CORDS BIPOLAR (ELECTRODE) ×2 IMPLANT
COVER MAYO STAND STRL (DRAPES) IMPLANT
DECANTER SPIKE VIAL GLASS SM (MISCELLANEOUS) ×2 IMPLANT
DRAIN SNY WOU 7FLT (WOUND CARE) IMPLANT
DRAIN SUBARACHNOID (WOUND CARE) IMPLANT
DRAPE CAMERA VIDEO/LASER (DRAPES) IMPLANT
DRAPE LONG LASER MIC (DRAPES) IMPLANT
DRAPE MICROSCOPE LEICA (MISCELLANEOUS) ×2 IMPLANT
DRAPE NEUROLOGICAL W/INCISE (DRAPES) ×2 IMPLANT
DRAPE ORTHO SPLIT 77X108 STRL (DRAPES)
DRAPE STERI IOBAN 125X83 (DRAPES) IMPLANT
DRAPE SURG 17X23 STRL (DRAPES) IMPLANT
DRAPE SURG IRRIG POUCH 19X23 (DRAPES) IMPLANT
DRAPE SURG ORHT 6 SPLT 77X108 (DRAPES) IMPLANT
DRAPE WARM FLUID 44X44 (DRAPE) ×2 IMPLANT
DRESSING TELFA 8X3 (GAUZE/BANDAGES/DRESSINGS) ×2 IMPLANT
DURAGUARD 06CMX08CM ×2 IMPLANT
DURAPREP 6ML APPLICATOR 50/CS (WOUND CARE) ×2 IMPLANT
ELECT CAUTERY BLADE 6.4 (BLADE) IMPLANT
ELECT REM PT RETURN 9FT ADLT (ELECTROSURGICAL) ×2
ELECTRODE REM PT RTRN 9FT ADLT (ELECTROSURGICAL) ×1 IMPLANT
EVACUATOR 1/8 PVC DRAIN (DRAIN) IMPLANT
EVACUATOR SILICONE 100CC (DRAIN) IMPLANT
GAUZE SPONGE 4X4 16PLY XRAY LF (GAUZE/BANDAGES/DRESSINGS) IMPLANT
GLOVE BIO SURGEON STRL SZ 6.5 (GLOVE) ×4 IMPLANT
GLOVE BIO SURGEON STRL SZ7 (GLOVE) IMPLANT
GLOVE BIO SURGEON STRL SZ7.5 (GLOVE) IMPLANT
GLOVE BIO SURGEON STRL SZ8 (GLOVE) IMPLANT
GLOVE BIO SURGEON STRL SZ8.5 (GLOVE) IMPLANT
GLOVE BIOGEL M 8.0 STRL (GLOVE) IMPLANT
GLOVE BIOGEL PI IND STRL 7.0 (GLOVE) ×1 IMPLANT
GLOVE BIOGEL PI INDICATOR 7.0 (GLOVE) ×1
GLOVE ECLIPSE 6.5 STRL STRAW (GLOVE) ×4 IMPLANT
GLOVE ECLIPSE 7.0 STRL STRAW (GLOVE) IMPLANT
GLOVE ECLIPSE 7.5 STRL STRAW (GLOVE) IMPLANT
GLOVE ECLIPSE 8.0 STRL XLNG CF (GLOVE) IMPLANT
GLOVE ECLIPSE 8.5 STRL (GLOVE) ×2 IMPLANT
GLOVE EXAM NITRILE LRG STRL (GLOVE) IMPLANT
GLOVE EXAM NITRILE MD LF STRL (GLOVE) ×4 IMPLANT
GLOVE EXAM NITRILE XL STR (GLOVE) IMPLANT
GLOVE EXAM NITRILE XS STR PU (GLOVE) IMPLANT
GLOVE INDICATOR 6.5 STRL GRN (GLOVE) IMPLANT
GLOVE INDICATOR 7.0 STRL GRN (GLOVE) IMPLANT
GLOVE INDICATOR 7.5 STRL GRN (GLOVE) IMPLANT
GLOVE INDICATOR 8.0 STRL GRN (GLOVE) IMPLANT
GLOVE INDICATOR 8.5 STRL (GLOVE) IMPLANT
GLOVE OPTIFIT SS 8.0 STRL (GLOVE) IMPLANT
GLOVE SURG SS PI 6.5 STRL IVOR (GLOVE) IMPLANT
GOWN BRE IMP SLV AUR LG STRL (GOWN DISPOSABLE) ×2 IMPLANT
GOWN BRE IMP SLV AUR XL STRL (GOWN DISPOSABLE) ×2 IMPLANT
GOWN STRL REIN 2XL LVL4 (GOWN DISPOSABLE) ×2 IMPLANT
HEMOSTAT SURGICEL 2X14 (HEMOSTASIS) ×2 IMPLANT
HOOK DURA (MISCELLANEOUS) ×2 IMPLANT
KIT BASIN OR (CUSTOM PROCEDURE TRAY) ×2 IMPLANT
KIT DRAIN CSF ACCUDRAIN (MISCELLANEOUS) IMPLANT
KIT ROOM TURNOVER OR (KITS) ×2 IMPLANT
NEEDLE HYPO 25X1 1.5 SAFETY (NEEDLE) ×2 IMPLANT
NEEDLE SPNL 18GX3.5 QUINCKE PK (NEEDLE) IMPLANT
NS IRRIG 1000ML POUR BTL (IV SOLUTION) ×4 IMPLANT
PACK CRANIOTOMY (CUSTOM PROCEDURE TRAY) ×2 IMPLANT
PAD EYE OVAL STERILE LF (GAUZE/BANDAGES/DRESSINGS) IMPLANT
PATTIES SURGICAL .25X.25 (GAUZE/BANDAGES/DRESSINGS) IMPLANT
PATTIES SURGICAL .5 X.5 (GAUZE/BANDAGES/DRESSINGS) IMPLANT
PATTIES SURGICAL .5 X3 (DISPOSABLE) ×4 IMPLANT
PATTIES SURGICAL 1/4 X 3 (GAUZE/BANDAGES/DRESSINGS) IMPLANT
PATTIES SURGICAL 1X1 (DISPOSABLE) IMPLANT
PLATE 1.5  2HOLE MED NEURO (Plate) ×6 IMPLANT
PLATE 1.5 2HOLE MED NEURO (Plate) ×6 IMPLANT
RUBBERBAND STERILE (MISCELLANEOUS) ×4 IMPLANT
SCREW SELF DRILL HT 1.5/4MM (Screw) ×24 IMPLANT
SET TUBING W/EXT DISP (INSTRUMENTS) ×2 IMPLANT
SPECIMEN JAR SMALL (MISCELLANEOUS) IMPLANT
SPONGE GAUZE 4X4 12PLY (GAUZE/BANDAGES/DRESSINGS) ×2 IMPLANT
SPONGE NEURO XRAY DETECT 1X3 (DISPOSABLE) IMPLANT
SPONGE SURGIFOAM ABS GEL 100 (HEMOSTASIS) ×2 IMPLANT
STAPLER VISISTAT 35W (STAPLE) ×4 IMPLANT
SUT ETHILON 3 0 FSL (SUTURE) IMPLANT
SUT ETHILON 3 0 PS 1 (SUTURE) IMPLANT
SUT NURALON 4 0 TR CR/8 (SUTURE) ×8 IMPLANT
SUT PL GUT 3 0 FS 1 (SUTURE) IMPLANT
SUT SILK 0 TIES 10X30 (SUTURE) IMPLANT
SUT STEEL 0 (SUTURE)
SUT STEEL 0 18XMFL TIE 17 (SUTURE) IMPLANT
SUT VIC AB 2-0 CT2 18 VCP726D (SUTURE) ×8 IMPLANT
SYR 20ML ECCENTRIC (SYRINGE) ×2 IMPLANT
SYR CONTROL 10ML LL (SYRINGE) ×2 IMPLANT
TAPE CLOTH 1X10 TAN NS (GAUZE/BANDAGES/DRESSINGS) ×2 IMPLANT
TIP SONASTAR STD MISONIX 1.9 (TRAY / TRAY PROCEDURE) IMPLANT
TIP STRAIGHT 25KHZ (INSTRUMENTS) ×2 IMPLANT
TOWEL OR 17X24 6PK STRL BLUE (TOWEL DISPOSABLE) ×2 IMPLANT
TOWEL OR 17X26 10 PK STRL BLUE (TOWEL DISPOSABLE) ×2 IMPLANT
TRAY FOLEY CATH 14FRSI W/METER (CATHETERS) IMPLANT
TUBE CONNECTING 12X1/4 (SUCTIONS) IMPLANT
UNDERPAD 30X30 INCONTINENT (UNDERPADS AND DIAPERS) IMPLANT
WATER STERILE IRR 1000ML POUR (IV SOLUTION) ×2 IMPLANT

## 2012-08-31 NOTE — Anesthesia Preprocedure Evaluation (Addendum)
Anesthesia Evaluation  Patient identified by MRN, date of birth, ID band Patient awake    Reviewed: Allergy & Precautions, H&P , NPO status , Patient's Chart, lab work & pertinent test results  Airway Mallampati: II TM Distance: >3 FB Neck ROM: full    Dental  (+) Edentulous Upper and Edentulous Lower   Pulmonary          Cardiovascular hypertension,     Neuro/Psych Temporal fossa mass causing aphasia    GI/Hepatic   Endo/Other    Renal/GU      Musculoskeletal   Abdominal   Peds  Hematology   Anesthesia Other Findings   Reproductive/Obstetrics                          Anesthesia Physical Anesthesia Plan  ASA: II  Anesthesia Plan: General   Post-op Pain Management:    Induction: Intravenous  Airway Management Planned: Oral ETT  Additional Equipment: Arterial line  Intra-op Plan:   Post-operative Plan: Extubation in OR  Informed Consent: I have reviewed the patients History and Physical, chart, labs and discussed the procedure including the risks, benefits and alternatives for the proposed anesthesia with the patient or authorized representative who has indicated his/her understanding and acceptance.     Plan Discussed with: CRNA and Surgeon  Anesthesia Plan Comments:         Anesthesia Quick Evaluation

## 2012-08-31 NOTE — Procedures (Signed)
S/P 4 vessel cerebral arteriogram followed by super selective embolization of RT ECA branches for presurgical devascularzation using PVA particles. Sig mass effect on Rt MCA prox and distally ,displaced anteriorly and superiorly. Acom patent. Dural venous sinuses patent.

## 2012-08-31 NOTE — Anesthesia Postprocedure Evaluation (Signed)
Anesthesia Post Note  Patient: William [name]  Procedure(s) Performed: Procedure(s) (LRB): CRANIOTOMY TUMOR EXCISION (Right)  Anesthesia type: general  Patient location: PACU  Post pain: Pain level controlled  Post assessment: Patient's Cardiovascular Status Stable  Last Vitals:  Filed Vitals:   08/31/12 1700  BP:   Pulse:   Temp: 36.7 C  Resp:     Post vital signs: Reviewed and stable  Level of consciousness: sedated  Complications: No apparent anesthesia complications

## 2012-08-31 NOTE — Op Note (Signed)
08/26/2012 - 08/31/2012  5:17 PM  PATIENT:  William [name]  78 y.o. male  PRE-OPERATIVE DIAGNOSIS:  brain tumor  POST-OPERATIVE DIAGNOSIS: Meningioma, right middle fossa  PROCEDURE:  Procedure(s): right frontal temporal parietal  CRANIOTOMY for TUMOR EXCISION  SURGEON:  Surgeon(s): Carmela Hurt, MD Temple Pacini, MD  ASSISTANTS:Henry Pool  ANESTHESIA:   general  EBL:  Total I/O In: 3000 [I.V.:3000] Out: 2125 [Urine:1950; Blood:175]  BLOOD ADMINISTERED:none  CELL SAVER GIVEN:none  COUNT:per nursing  DRAINS: none   SPECIMEN:  Source of Specimen:  intracranial extraaxial mass right middle fossa  DICTATION: William [name] was taken directly from the angiography suite, intubated and under general anesthesia,  after having the mass embolized to the neurosurgery operating room. . I positioned William [name] with his head turned towards the left, after applying the three pin mayfield head holder to ~65lbs of pressure. I shaved his head and then prepped his scalp in a sterile fashion. I infiltrated 10cc 1/2%lidocaine with 1/200000 strength epinephrine into the scalp in line with my planned incision. I made a reverse question mark incision carrying it just behind the ear on the right side and taking to the midline rostrally. I opened the scalp with a 10 blade and placed raney clips to ensure hemostasis. I advanced the scalp forward then split the temporalis fascia and reflected the temporalis muscle inferolaterally with towel hooks. Having exposed the pterion I then created my bone flap. I drilled a hole in the pterion, temporal bone and another posteriorly in the temporal bone. I turned the flap with a  Craniotome attachment. I thinned the sphenoid wing, and removed the bone flap cracking the thinned area. With Dr. Lindalou Hose assistance I drilled more of the sphenoid wing to flatten my approach to the middle fossa.  Having completed my bony removal I opened the dura basing it frontally. We secured the dura with a tack  up suture. At this time we proceeded with the tumor resection.  We resected the tumor by first removing some of the anterior inferior temporal lobe overlying the mass. The mass was then readily apparent. It had all of the characteristics of a meningioma. Frozen pathology indicated a meningioma.  Dr. Jordan Likes and I used the cusa, bipolar and suction to start the resection. We opened the capsule then removed tumor from inside. We gathered the edges of the capsule as we went freeing it from the brain. In a continuous fashion we removed tumor until the capsule was mobile enough that we were able to deliver it in a large piece. At that time the tentorium, anterior wall of the middle fossa, and temporal lobe were the boundaries. I had csf emanating medially along the edge of the tentorium when we were done. The cavernous sinus was my anterior medial border.. It was my impression that we had a gross total resection. I then irrigated, and cauterized the dura and pial edges of the temporal lobe. I placed surgicel, and gelfoam on the bony surfaces. I then closed the wound approximating the dura and using a patch to achieve full closure. I replaced the bone flap with plates and screws. I closed the scalp by first approximating the galeal layer, then staples for the scalp edges. I applied a sterile dressing. I removed the head holder. William [name] was extubated in the room and taken to recovery.    PLAN OF CARE: Admit to inpatient   PATIENT DISPOSITION:  PACU - hemodynamically stable.   Delay start of Pharmacological VTE agent (>  24hrs) due to surgical blood loss or risk of bleeding:  yes

## 2012-08-31 NOTE — Preoperative (Signed)
Beta Blockers   Reason not to administer Beta Blockers:Not Applicable 

## 2012-08-31 NOTE — Progress Notes (Signed)
Patient ID: William [name], male   DOB: 1934-05-17, 78 y.o.   MRN: [id number] BP 112/25  Pulse 66  Temp 97.4 F (36.3 C) (Axillary)  Resp 13  Ht 5\' 2"  (1.575 m)  Wt 63.3 kg (139 lb 8.8 oz)  BMI 25.52 kg/m2  SpO2 95% Alert confused according to son Moving all extremities perrl Dressing dry.  Doing well post op.

## 2012-08-31 NOTE — Transfer of Care (Signed)
Immediate Anesthesia Transfer of Care Note  Patient: William [name]  Procedure(s) Performed: Procedure(s) (LRB) with comments: RADIOLOGY WITH ANESTHESIA (N/A) - EMBOLIZATION    Patient Location: PACU  Anesthesia Type: General  Level of Consciousness: patient cooperative, lethargic and responds to stimulation  Airway & Oxygen Therapy: Patient Spontanous Breathing and Patient connected to nasal cannula oxygen  Post-op Assessment: Report given to PACU RN and patient opened eyes and squeezed right hand to command  Post vital signs: Reviewed and stable  Complications: No apparent anesthesia complications

## 2012-08-31 NOTE — Anesthesia Procedure Notes (Signed)
Procedure Name: Intubation Date/Time: 08/31/2012 10:00 AM Performed by: Jefm Miles E Pre-anesthesia Checklist: Patient identified, Patient being monitored, Emergency Drugs available, Timeout performed and Suction available Patient Re-evaluated:Patient Re-evaluated prior to inductionOxygen Delivery Method: Circle system utilized Preoxygenation: Pre-oxygenation with 100% oxygen Intubation Type: IV induction Ventilation: Mask ventilation without difficulty Laryngoscope Size: Mac and 3 Grade View: Grade I Tube type: Oral Tube size: 7.5 mm Number of attempts: 1 Airway Equipment and Method: Stylet Placement Confirmation: ETT inserted through vocal cords under direct vision,  breath sounds checked- equal and bilateral and positive ETCO2 Secured at: 22 cm Tube secured with: Tape Dental Injury: Teeth and Oropharynx as per pre-operative assessment

## 2012-08-31 NOTE — Transfer of Care (Signed)
Immediate Anesthesia Transfer of Care Note  Patient: William [name]  Procedure(s) Performed: Procedure(s) (LRB) with comments: CRANIOTOMY TUMOR EXCISION (Right) - RIGHT Craniotomy for tumor with cusa  Patient Location: PACU  Anesthesia Type: General  Level of Consciousness: awake, alert  and oriented  Airway & Oxygen Therapy: Patient Spontanous Breathing and Patient connected to nasal cannula oxygen  Post-op Assessment: Report given to PACU RN, Patient moving all extremities X 4 and following commands with son at bedside  Post vital signs: Reviewed and stable  Complications: No apparent anesthesia complications

## 2012-08-31 NOTE — Anesthesia Postprocedure Evaluation (Signed)
Anesthesia Post Note  Patient: William [name]  Procedure(s) Performed: Procedure(s) (LRB): RADIOLOGY WITH ANESTHESIA (N/A)  Anesthesia type: general  Patient location: PACU  Post pain: Pain level controlled  Post assessment: Patient's Cardiovascular Status Stable  Last Vitals:  Filed Vitals:   08/31/12 1700  BP:   Pulse:   Temp: 36.7 C  Resp:     Post vital signs: Reviewed and stable  Level of consciousness: sedated  Complications: No apparent anesthesia complications

## 2012-08-31 NOTE — Progress Notes (Signed)
Patient ID: William [name], male   DOB: Feb 28, 1934, 78 y.o.   MRN: [id number] Pt scheduled for cerebral arteriogram with embolization of right temporal fossa mass (most c/w meningioma) today prior to planned resection by Dr. Franky Macho. Additional PMH as below. Exam:  Pt awake/alert and oriented; chest-CTA bilat., heart- brady but reg rhythm; abd- soft,+BS,NT; ext- FROM, no edema, strength nl all fours; CN II-XII grossly intact Filed Vitals:   08/30/12 1058 08/30/12 2151 08/31/12 0132 08/31/12 0603  BP: 120/65 107/59 120/62 119/57  Pulse: 62 50 52 50  Temp: 98.5 F (36.9 C) 97.9 F (36.6 C) 98.1 F (36.7 C) 97.5 F (36.4 C)  TempSrc: Oral Oral Oral Oral  Resp: 18 18 20 20   Height:      Weight:      SpO2: 99% 97% 98% 100%   Past Medical History  Diagnosis Date  . Hypertension    No past surgical history on file. X-ray Chest Pa And Lateral   08/26/2012  *RADIOLOGY REPORT*  Clinical Data: Preoperative chest radiograph.  CHEST - 2 VIEW  Comparison: No priors.  Findings: Lung volumes are normal.  No consolidative airspace disease.  No pleural effusions.  No pneumothorax.  No pulmonary nodule or mass noted.  Pulmonary vasculature and the cardiomediastinal silhouette are within normal limits. Atherosclerosis in the thoracic aorta.  Prominence of the right first costochondral junction may relate to remote trauma or may be degenerative.  IMPRESSION: 1.  No radiographic evidence of acute cardiopulmonary disease. 2.  Atherosclerosis.   Original Report Authenticated By: Florencia Reasons, M.D.    Ct Head W Wo Contrast  08/25/2012  *RADIOLOGY REPORT*  Clinical Data: Left-sided weakness with facial droop and tremor. Vertigo.  CT HEAD WITHOUT AND WITH CONTRAST  Technique:  Contiguous axial images were obtained from the base of the skull through the vertex without and with intravenous contrast.  Contrast: 75mL OMNIPAQUE IOHEXOL 300 MG/ML  SOLN  Comparison: None.  Findings: There is a lobulated 44 x 52 mm intra-axial  lesion involving the right temporal lobe spreading to the uncinate fasciculus into the inferior right frontal lobe and medially towards the insular cortex. The lesion is roughly isodense with cerebral cortex precontrast and displays mild post contrast enhancement without significant central necrosis.  There is a significant amount of surrounding edema which affects the basal ganglia, internal capsule, and extends posteriorly toward the parietal lobe.  There is early right uncal herniation and moderate to right-to-left shift of 6 mm.  The right lateral ventricle is effaced.  No other similar lesions are seen.  Calvarium is intact. Moderate vascular calcification is noted.  The brain appears otherwise normal.  Sinuses and mastoids are clear.  IMPRESSION: 44 x 52 mm solitary right temporal intra-axial lesion displaying moderate post contrast enhancement. 6 mm right-to-left shift with early uncal herniation.  Concern is raised for primary brain tumor although a solitary metastasis is not excluded.  Neurosurgical consultation is warranted.   Original Report Authenticated By: Elsie Stain, M.D.    Mr Laqueta Jean Wo Contrast  08/26/2012  *RADIOLOGY REPORT*  Clinical Data: Left-sided weakness and dizziness.  Mass lesion.  MRI HEAD WITHOUT AND WITH CONTRAST  Technique:  Multiplanar, multiecho pulse sequences of the brain and surrounding structures were obtained according to standard protocol without and with intravenous contrast  Contrast: 15mL MULTIHANCE GADOBENATE DIMEGLUMINE 529 MG/ML IV SOLN  Comparison: CT head with and without contrast 08/25/2012.  Findings: A 4.4 x 7.0 x 4.7 cm right temporal fossa  mass demonstrates a CSF cleft around the tumor, compatible with an extra- axial tumor.  There is also restricted effusion in the tumor, compatible with a meningioma.  Enhancement is mildly heterogeneous. The tumor extends to the lateral margin of the cavernous sinus without invasion of the cavernous sinus or orbital apex.   There is marked displacement of the right temporal lobe inferior frontal lobe with significant surrounding vasogenic edema.  Midline shift of the mamillary bodies is 11 mm.  There is effacement of the right lateral ventricle.  There is no hydrocephalus.  Mild periventricular white matter changes are present.  Sulcal effacement is noted throughout the right hemisphere.  No other focal mass lesion is present.  There is no hemorrhage.  Flow is present in the major intracranial arteries.  The right middle cerebral artery extends around the tumor.  The patient is status post bilateral lens extractions.  The paranasal sinuses and mastoid air cells are clear.  IMPRESSION:  1.  4.4 x 7.0 x 4.7 cm right temporal fossa mass appears to be extra-axial and is most compatible with a meningioma. 2.  Significant right-sided mass effect with sulcal effacement and extensive vasogenic edema. 3.  11 mm of midline shift.   Original Report Authenticated By: Jamesetta Orleans. MATTERN, M.D.   Results for orders placed during the hospital encounter of 08/26/12  URINALYSIS, ROUTINE W REFLEX MICROSCOPIC      Component Value Range   Color, Urine YELLOW  YELLOW   APPearance CLEAR  CLEAR   Specific Gravity, Urine 1.014  1.005 - 1.030   pH 7.5  5.0 - 8.0   Glucose, UA NEGATIVE  NEGATIVE mg/dL   Hgb urine dipstick NEGATIVE  NEGATIVE   Bilirubin Urine NEGATIVE  NEGATIVE   Ketones, ur NEGATIVE  NEGATIVE mg/dL   Protein, ur NEGATIVE  NEGATIVE mg/dL   Urobilinogen, UA 0.2  0.0 - 1.0 mg/dL   Nitrite NEGATIVE  NEGATIVE   Leukocytes, UA NEGATIVE  NEGATIVE  BASIC METABOLIC PANEL      Component Value Range   Sodium 137  135 - 145 mEq/L   Potassium 4.1  3.5 - 5.1 mEq/L   Chloride 102  96 - 112 mEq/L   CO2 27  19 - 32 mEq/L   Glucose, Bld 113 (*) 70 - 99 mg/dL   BUN 13  6 - 23 mg/dL   Creatinine, Ser 9.14  0.50 - 1.35 mg/dL   Calcium 78.2  8.4 - 95.6 mg/dL   GFR calc non Af Amer 89 (*) >90 mL/min   GFR calc Af Amer >90  >90 mL/min    CBC WITH DIFFERENTIAL      Component Value Range   WBC 5.0  4.0 - 10.5 K/uL   RBC 4.37  4.22 - 5.81 MIL/uL   Hemoglobin 13.7  13.0 - 17.0 g/dL   HCT 21.3  08.6 - 57.8 %   MCV 89.5  78.0 - 100.0 fL   MCH 31.4  26.0 - 34.0 pg   MCHC 35.0  30.0 - 36.0 g/dL   RDW 46.9  62.9 - 52.8 %   Platelets 250  150 - 400 K/uL   Neutrophils Relative 66  43 - 77 %   Neutro Abs 3.3  1.7 - 7.7 K/uL   Lymphocytes Relative 27  12 - 46 %   Lymphs Abs 1.4  0.7 - 4.0 K/uL   Monocytes Relative 5  3 - 12 %   Monocytes Absolute 0.3  0.1 - 1.0 K/uL  Eosinophils Relative 2  0 - 5 %   Eosinophils Absolute 0.1  0.0 - 0.7 K/uL   Basophils Relative 0  0 - 1 %   Basophils Absolute 0.0  0.0 - 0.1 K/uL  MRSA PCR SCREENING      Component Value Range   MRSA by PCR NEGATIVE  NEGATIVE  COMPREHENSIVE METABOLIC PANEL      Component Value Range   Sodium 136  135 - 145 mEq/L   Potassium 4.1  3.5 - 5.1 mEq/L   Chloride 102  96 - 112 mEq/L   CO2 23  19 - 32 mEq/L   Glucose, Bld 143 (*) 70 - 99 mg/dL   BUN 12  6 - 23 mg/dL   Creatinine, Ser 9.60  0.50 - 1.35 mg/dL   Calcium 45.4  8.4 - 09.8 mg/dL   Total Protein 7.3  6.0 - 8.3 g/dL   Albumin 4.0  3.5 - 5.2 g/dL   AST 24  0 - 37 U/L   ALT 19  0 - 53 U/L   Alkaline Phosphatase 42  39 - 117 U/L   Total Bilirubin 0.5  0.3 - 1.2 mg/dL   GFR calc non Af Amer 90 (*) >90 mL/min   GFR calc Af Amer >90  >90 mL/min  CBC      Component Value Range   WBC 6.6  4.0 - 10.5 K/uL   RBC 4.42  4.22 - 5.81 MIL/uL   Hemoglobin 13.8  13.0 - 17.0 g/dL   HCT 11.9  14.7 - 82.9 %   MCV 90.0  78.0 - 100.0 fL   MCH 31.2  26.0 - 34.0 pg   MCHC 34.7  30.0 - 36.0 g/dL   RDW 56.2  13.0 - 86.5 %   Platelets 270  150 - 400 K/uL  DIFFERENTIAL      Component Value Range   Neutrophils Relative 89 (*) 43 - 77 %   Neutro Abs 5.8  1.7 - 7.7 K/uL   Lymphocytes Relative 10 (*) 12 - 46 %   Lymphs Abs 0.7  0.7 - 4.0 K/uL   Monocytes Relative 1 (*) 3 - 12 %   Monocytes Absolute 0.0 (*) 0.1 - 1.0  K/uL   Eosinophils Relative 0  0 - 5 %   Eosinophils Absolute 0.0  0.0 - 0.7 K/uL   Basophils Relative 0  0 - 1 %   Basophils Absolute 0.0  0.0 - 0.1 K/uL  APTT      Component Value Range   aPTT 35  24 - 37 seconds  PROTIME-INR      Component Value Range   Prothrombin Time 12.8  11.6 - 15.2 seconds   INR 0.97  0.00 - 1.49  CBC      Component Value Range   WBC 11.9 (*) 4.0 - 10.5 K/uL   RBC 4.46  4.22 - 5.81 MIL/uL   Hemoglobin 13.7  13.0 - 17.0 g/dL   HCT 78.4  69.6 - 29.5 %   MCV 87.9  78.0 - 100.0 fL   MCH 30.7  26.0 - 34.0 pg   MCHC 34.9  30.0 - 36.0 g/dL   RDW 28.4  13.2 - 44.0 %   Platelets 292  150 - 400 K/uL  BASIC METABOLIC PANEL      Component Value Range   Sodium 137  135 - 145 mEq/L   Potassium 4.0  3.5 - 5.1 mEq/L   Chloride 101  96 - 112  mEq/L   CO2 26  19 - 32 mEq/L   Glucose, Bld 117 (*) 70 - 99 mg/dL   BUN 24 (*) 6 - 23 mg/dL   Creatinine, Ser 2.13  0.50 - 1.35 mg/dL   Calcium 9.0  8.4 - 08.6 mg/dL   GFR calc non Af Amer 86 (*) >90 mL/min   GFR calc Af Amer >90  >90 mL/min   A/P: Pt with prior hx left sided weakness, dizziness and finding of right temporal fossa mass on CT most c/w meningioma. Plan is for cerebral arteriogram with embolization of mass prior to resection by NS. Details/risks of procedure d/w pt/pt's son with their understanding and consent.

## 2012-09-01 NOTE — Progress Notes (Addendum)
UR complete.   Pt discussed in hospital LOS meeting today.

## 2012-09-01 NOTE — Progress Notes (Signed)
Patient ID: William [name], male   DOB: July 14, 1934, 78 y.o.   MRN: [id number] BP 130/62  Pulse 56  Temp 98.6 F (37 C) (Oral)  Resp 17  Ht 5\' 2"  (1.575 m)  Wt 63.3 kg (139 lb 8.8 oz)  BMI 25.52 kg/m2  SpO2 97% Alert, following commands Perrl, full eom Symmetric facial movements lower half of face Tongue and uvula midline Dressing dry and intact Transfer to floor Doing well.

## 2012-09-02 ENCOUNTER — Encounter (HOSPITAL_COMMUNITY): Payer: Self-pay | Admitting: Neurosurgery

## 2012-09-02 MED ORDER — PANTOPRAZOLE SODIUM 40 MG PO TBEC
40.0000 mg | DELAYED_RELEASE_TABLET | Freq: Every day | ORAL | Status: DC
  Administered 2012-09-02: 40 mg via ORAL
  Filled 2012-09-02: qty 1

## 2012-09-02 MED ORDER — LEVETIRACETAM 500 MG PO TABS
500.0000 mg | ORAL_TABLET | Freq: Two times a day (BID) | ORAL | Status: DC
  Administered 2012-09-02 – 2012-09-03 (×2): 500 mg via ORAL
  Filled 2012-09-02 (×4): qty 1

## 2012-09-02 NOTE — Progress Notes (Signed)
Patient ID: William [name], male   DOB: 01/21/1934, 78 y.o.   MRN: [id number] BP 116/57  Pulse 59  Temp 98.5 F (36.9 C) (Oral)  Resp 20  Ht 5\' 2"  (1.575 m)  Wt 63.3 kg (139 lb 8.8 oz)  BMI 25.52 kg/m2  SpO2 96% Alert and following commands. Moving all extremities perrl Symmetric facies Tongue and uvula midline Doing well Possible dc tomorrow.

## 2012-09-03 ENCOUNTER — Encounter (HOSPITAL_COMMUNITY): Payer: Self-pay | Admitting: Radiology

## 2012-09-03 ENCOUNTER — Inpatient Hospital Stay (HOSPITAL_COMMUNITY): Payer: Medicare Other

## 2012-09-03 MED ORDER — LEVETIRACETAM 500 MG PO TABS: 500.0000 mg | ORAL_TABLET | Freq: Two times a day (BID) | ORAL | Status: DC

## 2012-09-03 MED ORDER — IOHEXOL 300 MG/ML  SOLN
100.0000 mL | Freq: Once | INTRAMUSCULAR | Status: AC | PRN
  Administered 2012-09-03: 100 mL via INTRAVENOUS

## 2012-09-03 MED ORDER — ACETAMINOPHEN-CODEINE #3 300-30 MG PO TABS: 1.0000 | ORAL_TABLET | Freq: Four times a day (QID) | ORAL | Status: DC | PRN

## 2012-09-03 NOTE — Discharge Summary (Signed)
Physician Discharge Summary  Patient ID: William [name] MRN: [id number] DOB/AGE: 07-16-1934 78 y.o.  Admit date: 08/26/2012 Discharge date: 09/03/2012  Admission Diagnoses:right middle fossa mass  Discharge Diagnoses: right middle fossa meningioma Principal Problem:  *Benign meningioma of brain   Discharged Condition: good  Hospital Course: William [name] was admitted and underwent evaluation for a middle fossa mass. An MRI was consistent with a meningioma causing significant edema, and mass effect. I recommended an operation to resect the mass. He underwent surgery, preliminary and final pathology confirming a WHO grade l meningioma. Post op he has done very well. His speech according to his family is improved, as is his cognition. His wound at discharge is clean, dry and without signs of infection. He ia moving all extremities well.  Consults: None  Significant Diagnostic Studies: MRI brain  Treatments: surgery: Right frontotemporal parietal craniotomy for tumor resection.  Discharge Exam: Blood pressure 110/59, pulse 57, temperature 98.8 F (37.1 C), temperature source Oral, resp. rate 8, height 5\' 2"  (1.575 m), weight 63.3 kg (139 lb 8.8 oz), SpO2 96.00%. General appearance: alert, cooperative and appears stated age Neurologic: Alert and oriented X 3, normal strength and tone. Normal symmetric reflexes. Normal coordination and gait  Disposition: Final discharge disposition not confirmed     Medication List     As of 09/03/2012  9:09 AM    TAKE these medications         acetaminophen-codeine 300-30 MG per tablet   Commonly known as: TYLENOL #3   Take 1 tablet by mouth every 6 (six) hours as needed for pain.      amLODipine 10 MG tablet   Commonly known as: NORVASC   Take 10 mg by mouth daily.      levETIRAcetam 500 MG tablet   Commonly known as: KEPPRA   Take 1 tablet (500 mg total) by mouth every 12 (twelve) hours.      lisinopril 40 MG tablet   Commonly known as:  PRINIVIL,ZESTRIL   Take 40 mg by mouth daily.           Follow-up Information    Follow up with Kailana Benninger L, MD. In 8 days. (call to make appt for suture removal)    Contact information:   [address] [place] [address] [phone]          Signed: Ayanah Snader L 09/03/2012, 9:09 AM

## 2012-09-03 NOTE — Plan of Care (Signed)
Problem: Consults Goal: Diagnosis - Craniotomy Outcome: Completed/Met Date Met:  09/03/12 Craniotomy for tumor removal

## 2012-12-04 ENCOUNTER — Other Ambulatory Visit (HOSPITAL_COMMUNITY): Payer: Self-pay | Admitting: Neurosurgery

## 2012-12-04 DIAGNOSIS — D329 Benign neoplasm of meninges, unspecified: Secondary | ICD-10-CM

## 2012-12-28 ENCOUNTER — Ambulatory Visit (HOSPITAL_COMMUNITY)
Admission: RE | Admit: 2012-12-28 | Discharge: 2012-12-28 | Disposition: A | Discharge status: Home / Self Care | Payer: Medicare Other | Source: Ambulatory Visit | Attending: Neurosurgery | Admitting: Neurosurgery

## 2012-12-28 DIAGNOSIS — D320 Benign neoplasm of cerebral meninges: Secondary | ICD-10-CM | POA: Insufficient documentation

## 2012-12-28 DIAGNOSIS — I619 Nontraumatic intracerebral hemorrhage, unspecified: Secondary | ICD-10-CM | POA: Insufficient documentation

## 2012-12-28 DIAGNOSIS — D329 Benign neoplasm of meninges, unspecified: Secondary | ICD-10-CM

## 2012-12-28 DIAGNOSIS — G9389 Other specified disorders of brain: Secondary | ICD-10-CM | POA: Insufficient documentation

## 2012-12-28 LAB — CREATININE, SERUM: Creatinine, Ser: 0.72 mg/dL (ref 0.50–1.35)

## 2012-12-28 MED ORDER — GADOBENATE DIMEGLUMINE 529 MG/ML IV SOLN
13.0000 mL | Freq: Once | INTRAVENOUS | Status: AC | PRN
  Administered 2012-12-28: 13 mL via INTRAVENOUS

## 2012-12-28 MED ORDER — GADOBENATE DIMEGLUMINE 529 MG/ML IV SOLN: 13.0000 mL | Freq: Once | INTRAVENOUS | Status: DC

## 2013-07-07 ENCOUNTER — Other Ambulatory Visit (HOSPITAL_COMMUNITY): Payer: Self-pay | Admitting: Neurosurgery

## 2013-07-07 DIAGNOSIS — D497 Neoplasm of unspecified behavior of endocrine glands and other parts of nervous system: Secondary | ICD-10-CM

## 2013-07-12 ENCOUNTER — Ambulatory Visit (HOSPITAL_COMMUNITY)
Admission: RE | Admit: 2013-07-12 | Discharge: 2013-07-12 | Disposition: A | Discharge status: Home / Self Care | Payer: Medicare Other | Source: Ambulatory Visit | Attending: Neurosurgery | Admitting: Neurosurgery

## 2013-07-12 DIAGNOSIS — D497 Neoplasm of unspecified behavior of endocrine glands and other parts of nervous system: Secondary | ICD-10-CM

## 2013-07-12 DIAGNOSIS — D320 Benign neoplasm of cerebral meninges: Secondary | ICD-10-CM | POA: Insufficient documentation

## 2013-07-12 LAB — CREATININE, SERUM: GFR calc Af Amer: >90 mL/min (ref >90)

## 2013-07-12 MED ORDER — GADOBENATE DIMEGLUMINE 529 MG/ML IV SOLN
13.0000 mL | Freq: Once | INTRAVENOUS | Status: AC
  Administered 2013-07-12: 13 mL via INTRAVENOUS

## 2013-07-15 ENCOUNTER — Other Ambulatory Visit: Payer: Self-pay | Admitting: Neurosurgery

## 2013-08-02 ENCOUNTER — Encounter (HOSPITAL_COMMUNITY)
Admission: RE | Admit: 2013-08-02 | Discharge: 2013-08-02 | Disposition: A | Discharge status: Home / Self Care | Payer: Medicare Other | Source: Ambulatory Visit | Attending: Neurosurgery | Admitting: Neurosurgery

## 2013-08-02 ENCOUNTER — Encounter (HOSPITAL_COMMUNITY): Payer: Self-pay | Admitting: Pharmacy Technician

## 2013-08-02 ENCOUNTER — Encounter (HOSPITAL_COMMUNITY): Payer: Self-pay

## 2013-08-02 DIAGNOSIS — Z01818 Encounter for other preprocedural examination: Secondary | ICD-10-CM | POA: Insufficient documentation

## 2013-08-02 DIAGNOSIS — Z01812 Encounter for preprocedural laboratory examination: Secondary | ICD-10-CM | POA: Insufficient documentation

## 2013-08-02 HISTORY — DX: Pure hypercholesterolemia, unspecified: E78.00

## 2013-08-02 LAB — BASIC METABOLIC PANEL
BUN: 15 mg/dL (ref 6–23)
CO2: 23 mEq/L (ref 19–32)
Chloride: 103 mEq/L (ref 96–112)
GFR calc Af Amer: >90 mL/min (ref >90)
Glucose, Bld: 120 mg/dL — ABNORMAL HIGH (ref 70–99)
Potassium: 4 mEq/L (ref 3.5–5.1)

## 2013-08-02 LAB — CBC
HCT: 40.1 % (ref 39.0–52.0)
Hemoglobin: 14.2 g/dL (ref 13.0–17.0)
RBC: 4.49 MIL/uL (ref 4.22–5.81)
RDW: 13.5 % (ref 11.5–15.5)
WBC: 6.9 10*3/uL (ref 4.0–10.5)

## 2013-08-02 LAB — ABO/RH: ABO/RH(D): A POS

## 2013-08-02 LAB — TYPE AND SCREEN

## 2013-08-02 NOTE — Progress Notes (Signed)
[employer]pre[employer] interpreted for patient during PAT appt.

## 2013-08-02 NOTE — Pre-Procedure Instructions (Signed)
William [name]  08/02/2013   Your procedure is scheduled on:  August 04, 2013 at 8:30 AM  Report to [facility] at 6:30 AM.  Call this number if you have problems the morning of surgery: [phone]   Remember:   Do not eat food or drink liquids after midnight.   Take these medicines the morning of surgery with A SIP OF WATER:  amLODipine (NORVASC)   Discontinue Aspirin, Coumadin, Plavix, Effient and herbal medication as of today.     Do not wear jewelry, make-up or nail polish.  Do not wear lotions, powders, or perfumes. You may wear deodorant.  Do not shave 48 hours prior to surgery. Men may shave face and neck.  Do not bring valuables to the hospital.  [facility] is not responsible                   for any belongings or valuables.  Contacts, dentures or bridgework may not be worn into surgery.  Leave suitcase in the car. After surgery it may be brought to your room.  For patients admitted to the hospital, checkout time is 11:00 AM the day of  discharge.    Special Instructions: Shower using CHG 2 nights before surgery and the night before surgery.  If you shower the day of surgery use CHG.  Use special wash - you have one bottle of CHG for all showers.  You should use approximately 1/3 of the bottle for each shower.   Please read over the following fact sheets that you were given: Pain Booklet, Coughing and Deep Breathing, Blood Transfusion Information and Surgical Site Infection Prevention

## 2013-08-03 MED ORDER — CEFAZOLIN SODIUM-DEXTROSE 2-3 GM-% IV SOLR
2.0000 g | INTRAVENOUS | Status: AC
  Administered 2013-08-04 (×2): 2 g via INTRAVENOUS
  Filled 2013-08-03: qty 50

## 2013-08-04 ENCOUNTER — Inpatient Hospital Stay (HOSPITAL_COMMUNITY): Payer: Medicare Other | Admitting: Certified Registered"

## 2013-08-04 ENCOUNTER — Encounter (HOSPITAL_COMMUNITY): Payer: Self-pay | Admitting: Surgery

## 2013-08-04 ENCOUNTER — Encounter (HOSPITAL_COMMUNITY): Payer: Self-pay | Admitting: Certified Registered"

## 2013-08-04 ENCOUNTER — Inpatient Hospital Stay (HOSPITAL_COMMUNITY)
Admission: RE | Admit: 2013-08-04 | Discharge: 2013-08-09 | DRG: 027 | Disposition: A | Discharge status: Home / Self Care | Payer: Medicare Other | Source: Ambulatory Visit | Attending: Neurosurgery | Admitting: Neurosurgery

## 2013-08-04 ENCOUNTER — Encounter (HOSPITAL_COMMUNITY)
Admission: RE | Disposition: A | Discharge status: Home / Self Care | Payer: Self-pay | Source: Ambulatory Visit | Attending: Neurosurgery

## 2013-08-04 ENCOUNTER — Other Ambulatory Visit: Payer: Self-pay | Admitting: Radiation Therapy

## 2013-08-04 DIAGNOSIS — D320 Benign neoplasm of cerebral meninges: Secondary | ICD-10-CM

## 2013-08-04 HISTORY — PX: CRANIOTOMY: SHX93

## 2013-08-04 LAB — CBC
HCT: 35.2 % — ABNORMAL LOW (ref 39.0–52.0)
Hemoglobin: 12.4 g/dL — ABNORMAL LOW (ref 13.0–17.0)
MCH: 31.3 pg (ref 26.0–34.0)
MCHC: 35.2 g/dL (ref 30.0–36.0)
RBC: 3.96 MIL/uL — ABNORMAL LOW (ref 4.22–5.81)

## 2013-08-04 LAB — MRSA PCR SCREENING: MRSA by PCR: NEGATIVE

## 2013-08-04 LAB — CREATININE, SERUM: GFR calc non Af Amer: 86 mL/min — ABNORMAL LOW (ref >90)

## 2013-08-04 SURGERY — CRANIOTOMY TUMOR EXCISION
Anesthesia: General | Site: Head | Laterality: Right | Wound class: Clean

## 2013-08-04 MED ORDER — ONDANSETRON HCL 4 MG/2ML IJ SOLN: 4.0000 mg | Freq: Once | INTRAMUSCULAR | Status: DC | PRN

## 2013-08-04 MED ORDER — NEOSTIGMINE METHYLSULFATE 1 MG/ML IJ SOLN
INTRAMUSCULAR | Status: DC | PRN
  Administered 2013-08-04: 3 mg via INTRAVENOUS

## 2013-08-04 MED ORDER — SODIUM CHLORIDE 0.9 % IR SOLN
Status: DC | PRN
  Administered 2013-08-04 (×3): 1000 mL

## 2013-08-04 MED ORDER — FENTANYL CITRATE 0.05 MG/ML IJ SOLN
INTRAMUSCULAR | Status: DC | PRN
  Administered 2013-08-04: 100 ug via INTRAVENOUS
  Administered 2013-08-04: 50 ug via INTRAVENOUS

## 2013-08-04 MED ORDER — INFLUENZA VAC SPLIT QUAD 0.5 ML IM SUSP
0.5000 mL | INTRAMUSCULAR | Status: AC
  Administered 2013-08-05: 0.5 mL via INTRAMUSCULAR
  Filled 2013-08-04: qty 0.5

## 2013-08-04 MED ORDER — VITAMIN E 180 MG (400 UNIT) PO CAPS
400.0000 [IU] | ORAL_CAPSULE | Freq: Every day | ORAL | Status: DC
  Administered 2013-08-05 – 2013-08-09 (×5): 400 [IU] via ORAL
  Filled 2013-08-04 (×5): qty 1

## 2013-08-04 MED ORDER — HEPARIN SODIUM (PORCINE) 5000 UNIT/ML IJ SOLN
5000.0000 [IU] | Freq: Three times a day (TID) | INTRAMUSCULAR | Status: DC
  Administered 2013-08-04 – 2013-08-07 (×9): 5000 [IU] via SUBCUTANEOUS
  Filled 2013-08-04 (×14): qty 1

## 2013-08-04 MED ORDER — OMEGA-3-ACID ETHYL ESTERS 1 G PO CAPS
1.0000 g | ORAL_CAPSULE | Freq: Every day | ORAL | Status: DC
  Administered 2013-08-05 – 2013-08-09 (×5): 1 g via ORAL
  Filled 2013-08-04 (×5): qty 1

## 2013-08-04 MED ORDER — CEFAZOLIN SODIUM-DEXTROSE 2-3 GM-% IV SOLR
INTRAVENOUS | Status: AC
  Filled 2013-08-04: qty 50

## 2013-08-04 MED ORDER — MANNITOL 25 % IV SOLN
INTRAVENOUS | Status: DC | PRN
  Administered 2013-08-04 (×3): 12.5 g via INTRAVENOUS

## 2013-08-04 MED ORDER — MORPHINE SULFATE 2 MG/ML IJ SOLN: 1.0000 mg | INTRAMUSCULAR | Status: DC | PRN

## 2013-08-04 MED ORDER — AMLODIPINE BESYLATE 10 MG PO TABS
10.0000 mg | ORAL_TABLET | Freq: Every day | ORAL | Status: DC
  Administered 2013-08-05 – 2013-08-09 (×5): 10 mg via ORAL
  Filled 2013-08-04 (×5): qty 1

## 2013-08-04 MED ORDER — PROMETHAZINE HCL 25 MG PO TABS: 12.5000 mg | ORAL_TABLET | ORAL | Status: DC | PRN

## 2013-08-04 MED ORDER — LISINOPRIL 40 MG PO TABS
40.0000 mg | ORAL_TABLET | Freq: Every day | ORAL | Status: DC
  Administered 2013-08-05 – 2013-08-09 (×5): 40 mg via ORAL
  Filled 2013-08-04 (×5): qty 1

## 2013-08-04 MED ORDER — POTASSIUM CHLORIDE IN NACL 20-0.9 MEQ/L-% IV SOLN
INTRAVENOUS | Status: DC
  Administered 2013-08-04: 15:00:00 via INTRAVENOUS
  Administered 2013-08-05: 80 mL/h via INTRAVENOUS
  Filled 2013-08-04 (×3): qty 1000

## 2013-08-04 MED ORDER — PROPOFOL 10 MG/ML IV BOLUS
INTRAVENOUS | Status: DC | PRN
  Administered 2013-08-04: 140 mg via INTRAVENOUS

## 2013-08-04 MED ORDER — MAGNESIUM CITRATE PO SOLN
1.0000 | Freq: Once | ORAL | Status: AC | PRN
  Filled 2013-08-04: qty 296

## 2013-08-04 MED ORDER — BISACODYL 5 MG PO TBEC
5.0000 mg | DELAYED_RELEASE_TABLET | Freq: Every day | ORAL | Status: DC | PRN
  Administered 2013-08-08: 5 mg via ORAL
  Filled 2013-08-04 (×2): qty 1

## 2013-08-04 MED ORDER — ACETAMINOPHEN 650 MG RE SUPP: 650.0000 mg | RECTAL | Status: DC | PRN

## 2013-08-04 MED ORDER — ONDANSETRON HCL 4 MG PO TABS: 4.0000 mg | ORAL_TABLET | ORAL | Status: DC | PRN

## 2013-08-04 MED ORDER — LIDOCAINE-EPINEPHRINE 0.5 %-1:200000 IJ SOLN
INTRAMUSCULAR | Status: DC | PRN
  Administered 2013-08-04: 10 mL

## 2013-08-04 MED ORDER — ONDANSETRON HCL 4 MG/2ML IJ SOLN: 4.0000 mg | INTRAMUSCULAR | Status: DC | PRN

## 2013-08-04 MED ORDER — LABETALOL HCL 5 MG/ML IV SOLN: 10.0000 mg | INTRAVENOUS | Status: DC | PRN

## 2013-08-04 MED ORDER — PHENYLEPHRINE HCL 10 MG/ML IJ SOLN
10.0000 mg | INTRAVENOUS | Status: DC | PRN
  Administered 2013-08-04: 50 ug/min via INTRAVENOUS

## 2013-08-04 MED ORDER — ACETAMINOPHEN 325 MG PO TABS: 650.0000 mg | ORAL_TABLET | ORAL | Status: DC | PRN

## 2013-08-04 MED ORDER — EPHEDRINE SULFATE 50 MG/ML IJ SOLN
INTRAMUSCULAR | Status: DC | PRN
  Administered 2013-08-04 (×2): 5 mg via INTRAVENOUS
  Administered 2013-08-04 (×2): 10 mg via INTRAVENOUS
  Administered 2013-08-04: 20 mg via INTRAVENOUS

## 2013-08-04 MED ORDER — LIDOCAINE HCL (CARDIAC) 20 MG/ML IV SOLN
INTRAVENOUS | Status: DC | PRN
  Administered 2013-08-04: 75 mg via INTRAVENOUS

## 2013-08-04 MED ORDER — DEXAMETHASONE 4 MG PO TABS
4.0000 mg | ORAL_TABLET | Freq: Four times a day (QID) | ORAL | Status: DC
  Administered 2013-08-05 (×2): 4 mg via ORAL
  Filled 2013-08-04 (×4): qty 1

## 2013-08-04 MED ORDER — ARTIFICIAL TEARS OP OINT
TOPICAL_OINTMENT | OPHTHALMIC | Status: DC | PRN
  Administered 2013-08-04: 1 via OPHTHALMIC

## 2013-08-04 MED ORDER — THROMBIN 20000 UNITS EX SOLR
CUTANEOUS | Status: DC | PRN
  Administered 2013-08-04: 10:00:00 via TOPICAL

## 2013-08-04 MED ORDER — GLYCOPYRROLATE 0.2 MG/ML IJ SOLN
INTRAMUSCULAR | Status: DC | PRN
  Administered 2013-08-04: 0.4 mg via INTRAVENOUS

## 2013-08-04 MED ORDER — HYDROMORPHONE HCL PF 1 MG/ML IJ SOLN: 0.2500 mg | INTRAMUSCULAR | Status: DC | PRN

## 2013-08-04 MED ORDER — PHENYLEPHRINE HCL 10 MG/ML IJ SOLN
INTRAMUSCULAR | Status: DC | PRN
  Administered 2013-08-04 (×4): 40 ug via INTRAVENOUS
  Administered 2013-08-04: 80 ug via INTRAVENOUS
  Administered 2013-08-04: 40 ug via INTRAVENOUS

## 2013-08-04 MED ORDER — NALOXONE HCL 0.4 MG/ML IJ SOLN: 0.0800 mg | INTRAMUSCULAR | Status: DC | PRN

## 2013-08-04 MED ORDER — SENNOSIDES-DOCUSATE SODIUM 8.6-50 MG PO TABS
1.0000 | ORAL_TABLET | Freq: Every evening | ORAL | Status: DC | PRN
  Administered 2013-08-04 – 2013-08-08 (×2): 1 via ORAL
  Filled 2013-08-04 (×2): qty 1

## 2013-08-04 MED ORDER — ASPIRIN EC 81 MG PO TBEC
81.0000 mg | DELAYED_RELEASE_TABLET | Freq: Every day | ORAL | Status: DC
  Administered 2013-08-05 – 2013-08-09 (×5): 81 mg via ORAL
  Filled 2013-08-04 (×5): qty 1

## 2013-08-04 MED ORDER — ONDANSETRON HCL 4 MG/2ML IJ SOLN
INTRAMUSCULAR | Status: DC | PRN
  Administered 2013-08-04: 4 mg via INTRAVENOUS

## 2013-08-04 MED ORDER — SIMVASTATIN 20 MG PO TABS
20.0000 mg | ORAL_TABLET | Freq: Every day | ORAL | Status: DC
  Administered 2013-08-04 – 2013-08-09 (×6): 20 mg via ORAL
  Filled 2013-08-04 (×6): qty 1

## 2013-08-04 MED ORDER — DEXAMETHASONE SODIUM PHOSPHATE 10 MG/ML IJ SOLN
INTRAMUSCULAR | Status: DC | PRN
  Administered 2013-08-04: 10 mg via INTRAVENOUS
  Administered 2013-08-04: 6 mg via INTRAVENOUS

## 2013-08-04 MED ORDER — DEXAMETHASONE 4 MG PO TABS: 4.0000 mg | ORAL_TABLET | Freq: Three times a day (TID) | ORAL | Status: DC

## 2013-08-04 MED ORDER — ULTRA OMEGA-3 FISH OIL 1400 MG PO CAPS: 1.0000 | ORAL_CAPSULE | Freq: Every day | ORAL | Status: DC

## 2013-08-04 MED ORDER — HYDROCODONE-ACETAMINOPHEN 5-325 MG PO TABS
1.0000 | ORAL_TABLET | ORAL | Status: DC | PRN
  Administered 2013-08-04 – 2013-08-07 (×4): 1 via ORAL
  Filled 2013-08-04 (×6): qty 1

## 2013-08-04 MED ORDER — SENNA 8.6 MG PO TABS
1.0000 | ORAL_TABLET | Freq: Two times a day (BID) | ORAL | Status: DC
  Administered 2013-08-04 – 2013-08-05 (×2): 8.6 mg via ORAL
  Filled 2013-08-04 (×4): qty 1

## 2013-08-04 MED ORDER — SODIUM CHLORIDE 0.9 % IV SOLN: 500.0000 mg | Freq: Two times a day (BID) | INTRAVENOUS | Status: DC

## 2013-08-04 MED ORDER — PANTOPRAZOLE SODIUM 40 MG IV SOLR
40.0000 mg | Freq: Every day | INTRAVENOUS | Status: DC
  Administered 2013-08-04: 40 mg via INTRAVENOUS
  Filled 2013-08-04 (×2): qty 40

## 2013-08-04 MED ORDER — LACTATED RINGERS IV SOLN
INTRAVENOUS | Status: DC | PRN
  Administered 2013-08-04 (×4): via INTRAVENOUS

## 2013-08-04 MED ORDER — ROCURONIUM BROMIDE 100 MG/10ML IV SOLN
INTRAVENOUS | Status: DC | PRN
  Administered 2013-08-04: 5 mg via INTRAVENOUS
  Administered 2013-08-04 (×2): 10 mg via INTRAVENOUS
  Administered 2013-08-04: 50 mg via INTRAVENOUS

## 2013-08-04 MED ORDER — SODIUM CHLORIDE 0.9 % IV SOLN
500.0000 mg | Freq: Two times a day (BID) | INTRAVENOUS | Status: DC
  Administered 2013-08-04 – 2013-08-05 (×3): 500 mg via INTRAVENOUS
  Filled 2013-08-04 (×6): qty 5

## 2013-08-04 MED ORDER — PNEUMOCOCCAL VAC POLYVALENT 25 MCG/0.5ML IJ INJ
0.5000 mL | INJECTION | INTRAMUSCULAR | Status: AC
  Administered 2013-08-05: 0.5 mL via INTRAMUSCULAR
  Filled 2013-08-04: qty 0.5

## 2013-08-04 MED ORDER — DEXAMETHASONE 6 MG PO TABS
6.0000 mg | ORAL_TABLET | Freq: Four times a day (QID) | ORAL | Status: AC
  Administered 2013-08-04 – 2013-08-05 (×4): 6 mg via ORAL
  Filled 2013-08-04 (×4): qty 1

## 2013-08-04 MED ORDER — CYANOCOBALAMIN 250 MCG PO TABS
250.0000 ug | ORAL_TABLET | Freq: Every day | ORAL | Status: DC
  Administered 2013-08-05 – 2013-08-09 (×5): 250 ug via ORAL
  Filled 2013-08-04 (×5): qty 1

## 2013-08-04 SURGICAL SUPPLY — 115 items
BANDAGE GAUZE 4  KLING STR (GAUZE/BANDAGES/DRESSINGS) ×2 IMPLANT
BANDAGE GAUZE ELAST BULKY 4 IN (GAUZE/BANDAGES/DRESSINGS) ×4 IMPLANT
BENZOIN TINCTURE PRP APPL 2/3 (GAUZE/BANDAGES/DRESSINGS) IMPLANT
BLADE SAW GIGLI 16 STRL (MISCELLANEOUS) IMPLANT
BLADE SURG 15 STRL LF DISP TIS (BLADE) IMPLANT
BLADE SURG 15 STRL SS (BLADE)
BLADE SURG ROTATE 9660 (MISCELLANEOUS) ×2 IMPLANT
BLADE ULTRA TIP 2M (BLADE) ×2 IMPLANT
BRUSH SCRUB EZ 1% IODOPHOR (MISCELLANEOUS) ×2 IMPLANT
BUR ACORN 6.0 PRECISION (BURR) ×2 IMPLANT
BUR ADDG 1.1 (BURR) IMPLANT
BUR MATCHSTICK NEURO 3.0 LAGG (BURR) IMPLANT
BUR ROUTER D-58 CRANI (BURR) IMPLANT
CANISTER SUCTION 2500CC (MISCELLANEOUS) ×2 IMPLANT
CATH VENTRIC 35X38 W/TROCAR LG (CATHETERS) IMPLANT
CLIP TI MEDIUM 6 (CLIP) IMPLANT
CLOTH BEACON ORANGE TIMEOUT ST (SAFETY) ×2 IMPLANT
CONT SPEC 4OZ CLIKSEAL STRL BL (MISCELLANEOUS) ×4 IMPLANT
CORDS BIPOLAR (ELECTRODE) ×2 IMPLANT
COVER MAYO STAND STRL (DRAPES) IMPLANT
DECANTER SPIKE VIAL GLASS SM (MISCELLANEOUS) ×2 IMPLANT
DRAIN SNY WOU 7FLT (WOUND CARE) IMPLANT
DRAIN SUBARACHNOID (WOUND CARE) IMPLANT
DRAPE CAMERA VIDEO/LASER (DRAPES) IMPLANT
DRAPE LONG LASER MIC (DRAPES) IMPLANT
DRAPE MICROSCOPE LEICA (MISCELLANEOUS) IMPLANT
DRAPE NEUROLOGICAL W/INCISE (DRAPES) ×2 IMPLANT
DRAPE ORTHO SPLIT 77X108 STRL (DRAPES)
DRAPE STERI IOBAN 125X83 (DRAPES) IMPLANT
DRAPE SURG 17X23 STRL (DRAPES) IMPLANT
DRAPE SURG IRRIG POUCH 19X23 (DRAPES) IMPLANT
DRAPE SURG ORHT 6 SPLT 77X108 (DRAPES) IMPLANT
DRAPE WARM FLUID 44X44 (DRAPE) ×2 IMPLANT
DRESSING TELFA 8X3 (GAUZE/BANDAGES/DRESSINGS) ×2 IMPLANT
DURAFORM SPONGE 2X2 SINGLE (Neuro Prosthesis/Implant) ×2 IMPLANT
DURAPREP 6ML APPLICATOR 50/CS (WOUND CARE) ×2 IMPLANT
ELECT CAUTERY BLADE 6.4 (BLADE) ×2 IMPLANT
ELECT REM PT RETURN 9FT ADLT (ELECTROSURGICAL) ×2
ELECTRODE REM PT RTRN 9FT ADLT (ELECTROSURGICAL) ×1 IMPLANT
EVACUATOR 1/8 PVC DRAIN (DRAIN) IMPLANT
EVACUATOR SILICONE 100CC (DRAIN) IMPLANT
FORCEPS BIPOLAR SPETZLER 8 1.0 (NEUROSURGERY SUPPLIES) ×2 IMPLANT
GAUZE SPONGE 4X4 16PLY XRAY LF (GAUZE/BANDAGES/DRESSINGS) IMPLANT
GLOVE BIO SURGEON STRL SZ 6.5 (GLOVE) IMPLANT
GLOVE BIO SURGEON STRL SZ7 (GLOVE) IMPLANT
GLOVE BIO SURGEON STRL SZ7.5 (GLOVE) IMPLANT
GLOVE BIO SURGEON STRL SZ8 (GLOVE) IMPLANT
GLOVE BIO SURGEON STRL SZ8.5 (GLOVE) IMPLANT
GLOVE BIOGEL M 8.0 STRL (GLOVE) IMPLANT
GLOVE BIOGEL PI IND STRL 7.0 (GLOVE) ×4 IMPLANT
GLOVE BIOGEL PI IND STRL 8 (GLOVE) ×1 IMPLANT
GLOVE BIOGEL PI INDICATOR 7.0 (GLOVE) ×4
GLOVE BIOGEL PI INDICATOR 8 (GLOVE) ×1
GLOVE ECLIPSE 6.5 STRL STRAW (GLOVE) ×2 IMPLANT
GLOVE ECLIPSE 7.0 STRL STRAW (GLOVE) IMPLANT
GLOVE ECLIPSE 7.5 STRL STRAW (GLOVE) IMPLANT
GLOVE ECLIPSE 8.0 STRL XLNG CF (GLOVE) IMPLANT
GLOVE ECLIPSE 8.5 STRL (GLOVE) IMPLANT
GLOVE EXAM NITRILE LRG STRL (GLOVE) IMPLANT
GLOVE EXAM NITRILE MD LF STRL (GLOVE) IMPLANT
GLOVE EXAM NITRILE XL STR (GLOVE) IMPLANT
GLOVE EXAM NITRILE XS STR PU (GLOVE) IMPLANT
GLOVE INDICATOR 6.5 STRL GRN (GLOVE) IMPLANT
GLOVE INDICATOR 7.0 STRL GRN (GLOVE) IMPLANT
GLOVE INDICATOR 7.5 STRL GRN (GLOVE) IMPLANT
GLOVE INDICATOR 8.0 STRL GRN (GLOVE) IMPLANT
GLOVE INDICATOR 8.5 STRL (GLOVE) ×2 IMPLANT
GLOVE OPTIFIT SS 8.0 STRL (GLOVE) IMPLANT
GLOVE SS N UNI LF 7.0 STRL (GLOVE) ×8 IMPLANT
GLOVE SURG SS PI 6.5 STRL IVOR (GLOVE) IMPLANT
GOWN BRE IMP SLV AUR LG STRL (GOWN DISPOSABLE) ×2 IMPLANT
GOWN BRE IMP SLV AUR XL STRL (GOWN DISPOSABLE) ×6 IMPLANT
GOWN STRL REIN 2XL LVL4 (GOWN DISPOSABLE) IMPLANT
HEMOSTAT SURGICEL 2X14 (HEMOSTASIS) IMPLANT
HOOK DURA (MISCELLANEOUS) ×2 IMPLANT
KIT BASIN OR (CUSTOM PROCEDURE TRAY) ×2 IMPLANT
KIT DRAIN CSF ACCUDRAIN (MISCELLANEOUS) IMPLANT
KIT ROOM TURNOVER OR (KITS) ×2 IMPLANT
NEEDLE HYPO 25X1 1.5 SAFETY (NEEDLE) ×2 IMPLANT
NEEDLE SPNL 18GX3.5 QUINCKE PK (NEEDLE) IMPLANT
NS IRRIG 1000ML POUR BTL (IV SOLUTION) ×2 IMPLANT
PACK CRANIOTOMY (CUSTOM PROCEDURE TRAY) ×2 IMPLANT
PAD EYE OVAL STERILE LF (GAUZE/BANDAGES/DRESSINGS) IMPLANT
PATTIES SURGICAL .25X.25 (GAUZE/BANDAGES/DRESSINGS) IMPLANT
PATTIES SURGICAL .5 X.5 (GAUZE/BANDAGES/DRESSINGS) IMPLANT
PATTIES SURGICAL .5 X1 (DISPOSABLE) ×4 IMPLANT
PATTIES SURGICAL .5 X3 (DISPOSABLE) ×4 IMPLANT
PATTIES SURGICAL 1/4 X 3 (GAUZE/BANDAGES/DRESSINGS) IMPLANT
PATTIES SURGICAL 1X1 (DISPOSABLE) ×2 IMPLANT
RUBBERBAND STERILE (MISCELLANEOUS) IMPLANT
SCREW SELF DRILL HT 1.5/4MM (Screw) ×12 IMPLANT
SET TUBING W/EXT DISP (INSTRUMENTS) ×2 IMPLANT
SPECIMEN JAR SMALL (MISCELLANEOUS) IMPLANT
SPONGE GAUZE 4X4 12PLY (GAUZE/BANDAGES/DRESSINGS) ×2 IMPLANT
SPONGE NEURO XRAY DETECT 1X3 (DISPOSABLE) ×2 IMPLANT
SPONGE SURGIFOAM ABS GEL 100 (HEMOSTASIS) ×2 IMPLANT
STAPLER VISISTAT 35W (STAPLE) ×4 IMPLANT
SUT ETHILON 3 0 FSL (SUTURE) IMPLANT
SUT ETHILON 3 0 PS 1 (SUTURE) IMPLANT
SUT NURALON 4 0 TR CR/8 (SUTURE) ×6 IMPLANT
SUT PL GUT 3 0 FS 1 (SUTURE) IMPLANT
SUT SILK 0 TIES 10X30 (SUTURE) IMPLANT
SUT STEEL 0 (SUTURE)
SUT STEEL 0 18XMFL TIE 17 (SUTURE) IMPLANT
SUT VIC AB 2-0 CT2 18 VCP726D (SUTURE) ×4 IMPLANT
SYR 20ML ECCENTRIC (SYRINGE) ×2 IMPLANT
SYR CONTROL 10ML LL (SYRINGE) ×2 IMPLANT
TIP SONASTAR STD MISONIX 1.9 (TRAY / TRAY PROCEDURE) IMPLANT
TIP STRAIGHT 25KHZ (INSTRUMENTS) ×2 IMPLANT
TOWEL OR 17X24 6PK STRL BLUE (TOWEL DISPOSABLE) ×2 IMPLANT
TOWEL OR 17X26 10 PK STRL BLUE (TOWEL DISPOSABLE) ×2 IMPLANT
TRAY FOLEY CATH 14FRSI W/METER (CATHETERS) ×2 IMPLANT
TUBE CONNECTING 12X1/4 (SUCTIONS) ×2 IMPLANT
UNDERPAD 30X30 INCONTINENT (UNDERPADS AND DIAPERS) ×2 IMPLANT
WATER STERILE IRR 1000ML POUR (IV SOLUTION) ×2 IMPLANT

## 2013-08-04 NOTE — Anesthesia Postprocedure Evaluation (Signed)
  Anesthesia Post-op Note  Patient: William [name]  Procedure(s) Performed: Procedure(s) with comments: CRANIOTOMY TUMOR EXCISION (Right) - RIGHT temporal Craniotomy for tumor resection  Patient Location: PACU  Anesthesia Type:General  Level of Consciousness: awake, oriented, sedated and patient cooperative  Airway and Oxygen Therapy: Patient Spontanous Breathing  Post-op Pain: mild  Post-op Assessment: Post-op Vital signs reviewed, Patient's Cardiovascular Status Stable, Respiratory Function Stable, Patent Airway, No signs of Nausea or vomiting and Pain level controlled  Post-op Vital Signs: stable  Complications: No apparent anesthesia complications

## 2013-08-04 NOTE — Op Note (Signed)
08/04/2013  5:48 PM  PATIENT:  William [name]  79 y.o. male with a recurrent meningioma admitted for resection.   PRE-OPERATIVE DIAGNOSIS:  Meningioma right sphenoid wing  POST-OPERATIVE DIAGNOSIS:  Meningioma right sphenoid wing  PROCEDURE:  Procedure(s): CRANIOTOMY  For TUMOR EXCISION Microscopic dissection  SURGEON:  Surgeon(s): Carmela Hurt, MD Mariam Dollar, MD  ASSISTANTS:Cram, gary  ANESTHESIA:   general  EBL:  Total I/O In: 2861.3 [I.V.:2861.3] Out: 1585 [Urine:1385; Blood:200]  BLOOD ADMINISTERED:none  CELL SAVER GIVEN:none  COUNT:per nursing  DRAINS: none   SPECIMEN:  Source of Specimen:  spenoid wing  DICTATION: [name] was taken to the operating room, intubated and placed under a general anesthetic without difficulty. His head was shaved and prepped in a sterile manner. He was placed in a 3pin mayfield head holder after adequate anesthesia was obtained. I positioned his head for a pterional approach. I opened the previous incision not needing to change it at all. I reflected the scalp rostrally. I divided the temporalis muscle with monopolar cautery and reflected the anterior portion rostrally with the scalp. I exposed the bone flap and then removed the screws on the skull side. I removed the bone flap without difficulty. I then opened the dura and dissected slowly and carefully due to the scarring which had formed from the previous operation. With Dr. Lonie Peak assistance we then dissected rostrally in the middle fossa exposing part of the tumor With this exposure we then for the rest of the case followed the capsule to dissect and to locate the tumor. In the middle fossa were were able with suction, the cusa, and sharp dissection to remove the tumor from the dura and clear the temporal pole. We then followed the capsule into the frontal fossa and with a number of techniques removed the tumor and also exposed the medial tentorium, and the small amount of tumor in the  posterior fossa. During the dissection csf was emanating from the temporal horn on the right. Upon further inspection a large component of the tumor was still present but we were able to remove that part which extended into the frontal and middle fossae essentially in one large piece. It was not very adherent to the surrounding brain. We then cauterized vigorously the dura in the middle fossa where the attachment and origin of the tumor seemed to be. We irrigated, and used the microscope during the case for microdissection. We inspected the resection cavity and were satisfied that we achieved a gross total resection.  We then approximated the dura mater, and I used a small piece of duragen to cover a small opening left after placing the sutures. We replaced the bone flap using new screws. I approximated the galea with vicryls and the scalp edges with staples. I removed her from the pins and placed a sterile dressing.   PLAN OF CARE: Admit to inpatient   PATIENT DISPOSITION:  PACU - hemodynamically stable.   Delay start of Pharmacological VTE agent (>24hrs) due to surgical blood loss or risk of bleeding:  yes

## 2013-08-04 NOTE — Progress Notes (Signed)
Patient ID: William [name], male   DOB: 03/12/1934, 79 y.o.   MRN: [id number] BP 132/53  Pulse 81  Temp(Src) 98.2 F (36.8 C) (Axillary)  Resp 14  Ht 5\' 8"  (1.727 m)  Wt 67.6 kg (149 lb 0.5 oz)  BMI 22.67 kg/m2  SpO2 97% Alert and oriented x 4 Moving all extremities well Dressing dry and in place Doing well post op Following all commands

## 2013-08-04 NOTE — Plan of Care (Signed)
Problem: Consults Goal: Diagnosis - Craniotomy Outcome: Completed/Met Date Met:  08/04/13 Tumor Resection

## 2013-08-04 NOTE — H&P (Signed)
  BP 125/56  Pulse 56  Temp(Src) 98.3 F (36.8 C) (Oral)  Resp 18  SpO2 100% ZO:[id number], recurrentf    William [name] meningioma has grown back in a way that I have never seen before. He had his original operation in October of 2013. He had a postoperative scan in February of 2014, which was clean. He comes back today and the tumor has grown back to be half the size, about 4 x 5 cm, distorting the right temporal lobe and extending into the posterior fossa. This needs to be resected once more.   His son states that he has had some memory problems and maybe has not been quite as good as he had been right after his operation.   MEDICATIONS:                         He takes no medications.   ALLERGIES:                 No known drug allergies.   REVIEW OF SYSTEMS:                        Positive only for memory impairment. He denies allergic, cardiovascular, constitutional, endocrine, ear, nose, throat, mouth, eye, GI, GU, hematologic, musculoskeletal, psychiatric, or reproductive problems.   PHYSICAL EXAMINATION:        Height is 63". He is 153 pounds. BMI is 27.1. Blood pressure is 135/70. Pulse is 65. Neurologically, he is unchanged on physical exam. Physical Exam  Constitutional: He is oriented to person, place, and time. He appears well-developed and well-nourished.  HENT:  Right Ear: External ear normal.  Left Ear: External ear normal.  Eyes: EOM are normal. Pupils are equal, round, and reactive to light.  Neck: Normal range of motion. Neck supple.  Cardiovascular: Normal rate, regular rhythm and normal heart sounds.   Pulmonary/Chest: Effort normal and breath sounds normal.  Musculoskeletal: Normal range of motion.  Neurological: He is alert and oriented to person, place, and time. He has normal reflexes. He displays normal reflexes. No cranial nerve deficit. He exhibits normal muscle tone. Coordination normal.  Psychiatric: He has a normal mood and affect. His behavior is normal. Judgment  and thought content normal.      IMPRESSION/PLAN:                 The MRI today shows significant regrowth of tumor. As a result of that, he does need to have this resected again, and it does need to be a right temporal craniotomy, and he does need to now have radiation postoperatively. Risks and benefits were explained. His son understands, as does William [name], and we will get this set up when they can look at the scheduling and give me a call.

## 2013-08-04 NOTE — Transfer of Care (Signed)
Immediate Anesthesia Transfer of Care Note  Patient: William [name]  Procedure(s) Performed: Procedure(s) with comments: CRANIOTOMY TUMOR EXCISION (Right) - RIGHT temporal Craniotomy for tumor resection  Patient Location: PACU  Anesthesia Type:General  Level of Consciousness: awake, alert , oriented and patient cooperative  Airway & Oxygen Therapy: Patient Spontanous Breathing and Patient connected to face mask  Post-op Assessment: Report given to PACU RN, Post -op Vital signs reviewed and stable and Patient moving all extremities  Post vital signs: Reviewed and stable  Complications: No apparent anesthesia complications

## 2013-08-04 NOTE — Progress Notes (Signed)
UR COMPLETED  

## 2013-08-04 NOTE — Anesthesia Preprocedure Evaluation (Addendum)
Anesthesia Evaluation  Patient identified by MRN, date of birth, ID band Patient awake    Reviewed: Allergy & Precautions, H&P , NPO status , Patient's Chart, lab work & pertinent test results  Airway Mallampati: I TM Distance: >3 FB Neck ROM: full    Dental   Pulmonary former smoker,          Cardiovascular hypertension, Rhythm:regular Rate:Normal     Neuro/Psych    GI/Hepatic   Endo/Other    Renal/GU      Musculoskeletal   Abdominal   Peds  Hematology   Anesthesia Other Findings Brain tumor  Reproductive/Obstetrics                          Anesthesia Physical Anesthesia Plan  ASA: II  Anesthesia Plan: General   Post-op Pain Management:    Induction: Intravenous  Airway Management Planned: Oral ETT  Additional Equipment:   Intra-op Plan:   Post-operative Plan: Possible Post-op intubation/ventilation  Informed Consent: I have reviewed the patients History and Physical, chart, labs and discussed the procedure including the risks, benefits and alternatives for the proposed anesthesia with the patient or authorized representative who has indicated his/her understanding and acceptance.     Plan Discussed with: CRNA, Anesthesiologist and Surgeon  Anesthesia Plan Comments:         Anesthesia Quick Evaluation

## 2013-08-04 NOTE — Progress Notes (Signed)
PT. Alert and oriented per interpreter

## 2013-08-05 ENCOUNTER — Inpatient Hospital Stay (HOSPITAL_COMMUNITY): Payer: Medicare Other

## 2013-08-05 ENCOUNTER — Encounter (HOSPITAL_COMMUNITY): Payer: Self-pay | Admitting: Neurosurgery

## 2013-08-05 MED ORDER — LEVETIRACETAM 500 MG PO TABS
500.0000 mg | ORAL_TABLET | Freq: Two times a day (BID) | ORAL | Status: DC
  Administered 2013-08-05 – 2013-08-09 (×8): 500 mg via ORAL
  Filled 2013-08-05 (×10): qty 1

## 2013-08-05 MED ORDER — PANTOPRAZOLE SODIUM 40 MG PO TBEC
40.0000 mg | DELAYED_RELEASE_TABLET | Freq: Every day | ORAL | Status: DC
  Administered 2013-08-05 – 2013-08-08 (×4): 40 mg via ORAL
  Filled 2013-08-05 (×4): qty 1

## 2013-08-05 NOTE — Progress Notes (Signed)
Pt a 79 yrs old [place] speaking male  Transfer from 3100 with Dx brain tumor with S/P cranietomy . Oriented x4 .speaks little [place] . Moves all extremities  No distress noted at this time oriented to room, safety and fall precaution explained to pt and family call bell within reach .Rn will continue to monitor pt.

## 2013-08-05 NOTE — Progress Notes (Signed)
Patient ID: William [name], male   DOB: 10-27-1934, 79 y.o.   MRN: [id number] BP 127/67  Pulse 76  Temp(Src) 98.1 F (36.7 C) (Oral)  Resp 22  Ht 5\' 8"  (1.727 m)  Wt 67.6 kg (149 lb 0.5 oz)  BMI 22.67 kg/m2  SpO2 97% Alert and oriented x 4, moving all extremities well Perrl, full eom Following all commands CT shows expected changes Doing well

## 2013-08-05 NOTE — Progress Notes (Signed)
Patient ID: William [name], male   DOB: 07-17-1934, 79 y.o.   MRN: [id number] BP 116/50  Pulse 71  Temp(Src) 98.9 F (37.2 C) (Oral)  Resp 21  Ht 5\' 8"  (1.727 m)  Wt 67.6 kg (149 lb 0.5 oz)  BMI 22.67 kg/m2  SpO2 96% Alert and oriented x 4 speech is clear and fluent Perrl, full eom Tongue and uvula midline Moving all extremities well Dressing blood stained, intact CT today looked good, unfortunately no contrast was given. Will do MRI before discharge

## 2013-08-06 ENCOUNTER — Inpatient Hospital Stay (HOSPITAL_COMMUNITY): Payer: Medicare Other

## 2013-08-06 MED ORDER — LEVETIRACETAM 500 MG PO TABS: 500.0000 mg | ORAL_TABLET | Freq: Two times a day (BID) | ORAL | Status: DC

## 2013-08-06 MED ORDER — DIPHENHYDRAMINE HCL 12.5 MG/5ML PO ELIX
12.5000 mg | ORAL_SOLUTION | Freq: Once | ORAL | Status: AC
  Administered 2013-08-06: via ORAL
  Filled 2013-08-06: qty 5

## 2013-08-06 MED ORDER — ZOLPIDEM TARTRATE 5 MG PO TABS: 5.0000 mg | ORAL_TABLET | Freq: Once | ORAL | Status: DC

## 2013-08-06 MED ORDER — ACETAMINOPHEN-CODEINE #3 300-30 MG PO TABS: 1.0000 | ORAL_TABLET | Freq: Four times a day (QID) | ORAL | Status: DC | PRN

## 2013-08-06 MED ORDER — GADOBENATE DIMEGLUMINE 529 MG/ML IV SOLN
15.0000 mL | Freq: Once | INTRAVENOUS | Status: AC | PRN
  Administered 2013-08-06: 15 mL via INTRAVENOUS

## 2013-08-06 NOTE — Progress Notes (Signed)
Patients head bandage has been changed twice in a 12 hour shift. MD please take a look at incision site.

## 2013-08-06 NOTE — Progress Notes (Signed)
Pt dressing to head incision reinforced as bleeding noted. MD notified and awaiting  MD to call back

## 2013-08-06 NOTE — Progress Notes (Signed)
Dressing changed to head incision   At 1220 noted some slight bleeding Md aware  . No bleeding at this time. dsg d/c/I .

## 2013-08-06 NOTE — Progress Notes (Signed)
Patient ID: William [name], male   DOB: [birth year], 79 y.o.   MRN: [id number] BP 116/60  Pulse 75  Temp(Src) 98.7 F (37.1 C) (Oral)  Resp 16  Ht 5\' 8"  (1.727 m)  Wt 67.6 kg (149 lb 0.5 oz)  BMI 22.67 kg/m2  SpO2 97% Alert and oriented Moving all extremities Wound is clean, some oozing at inferior portion of the incision. Continuing with therapy Possible discharge tomorrow.  Mri ordered, needs to be done before discharge.

## 2013-08-06 NOTE — Progress Notes (Signed)
Pt's wound observed to be saturated with blood,dressing change done,family requested if pt can get any medication to help him sleep,said not to have slept for 2 days,Dr Kritzer(on call) paged and notified,ordered to give benadryl 12.5mg  orally first,also to give 5mg  of ambien if benadryl is not effective,same ordered,pt's [employer] notified. Obasogie-Asidi, Marrian Bells Efe

## 2013-08-07 MED ORDER — PNEUMOCOCCAL 13-VAL CONJ VACC IM SUSP
0.5000 mL | INTRAMUSCULAR | Status: DC
  Filled 2013-08-07: qty 0.5

## 2013-08-07 MED ORDER — INFLUENZA VAC SPLIT QUAD 0.5 ML IM SUSP
0.5000 mL | INTRAMUSCULAR | Status: AC
  Administered 2013-08-08: 0.5 mL via INTRAMUSCULAR
  Filled 2013-08-07: qty 0.5

## 2013-08-07 NOTE — Progress Notes (Signed)
Subjective: Patient resting in bed. Has been out of bed only to the bathroom. The the nursing staff nor family know how steady patient is with ambulation. Nursing staff concerned by persistent bloody oozing from incision, and having had to repeatedly change cranial dressing, and continued heparin subcutaneous.  Objective: Vital signs in last 24 hours: Filed Vitals:   08/06/13 1802 08/06/13 2056 08/07/13 0149 08/07/13 0520  BP: 116/60 111/61 114/64 129/65  Pulse: 75 63 64 58  Temp: 98.7 F (37.1 C) 98.6 F (37 C) 98.6 F (37 C) 98 F (36.7 C)  TempSrc: Oral Oral Oral Oral  Resp: 16 16 17 17   Height:      Weight:      SpO2: 97% 95% 97% 96%    Intake/Output from previous day: 09/19 0701 - 09/20 0700 In: 480 [P.O.:480] Out: 1140 [Urine:1140] Intake/Output this shift: Total I/O In: 360 [P.O.:360] Out: -   Physical Exam:  Awake and following commands. Moderate swelling of scalp and right eyelid. Moderate bloody drainage on cranial dressing.  CBC  Recent Labs  08/04/13 1916  WBC 13.7*  HGB 12.4*  HCT 35.2*  PLT 251   BMET  Recent Labs  08/04/13 1916  CREATININE 0.75     Studies/Results: Mr William [name] ZO Contrast  08/07/2013   *RADIOLOGY REPORT*  Clinical Data: Status post tumor resection, evaluate postop hip changes.  MRI HEAD WITHOUT AND WITH CONTRAST  Technique:  Multiplanar, multiecho pulse sequences of the brain and surrounding structures were obtained according to standard protocol without and with intravenous contrast  Contrast: 15mL MULTIHANCE GADOBENATE DIMEGLUMINE 529 MG/ML IV SOLN  Comparison: Prior CT from 08/02/2013 median the MRI from 07/12/2013.  Sequelae of recent pterional resection of previously identified large enhancing extra-axial mass centered along the right sphenoid wing are again seen.  Bone flap remains in place.  Overlying soft tissue swelling of the right scalp and galea is similar as compared to the prior CT. Pneumocephalus persists, slightly  decreased as compared to prior CT.  Again, extra-axial fluid collection subjacent to the bone flap in the right frontotemporal region measures 9 mm in greatest axial diameter, similar to prior.  A few nodular foci of enhancement with restricted diffusion are seen within this collection, compatible with blood products.  Edema within the anterior right temporal lobe and right middle cranial fossa are not significantly changed.  Several scattered foci of restricted diffusion are seen along the resection margin in the right frontal and temporal regions.  Additionally, restricted effusion is seen at the medial aspect of the right temporal lobe ( series 5, image 13) and within the right basal ganglia (series 5, image 15). Additional subcentimeter foci of restricted diffusion are seen in the cortical left frontal lobe (series 5, image 21), superiorly in the right frontal convexity (series 5, image 25), and along the falx (series 5, image 22).  There is partial effacement of the right perimesencephalic cistern and right lateral ventricle.  There is persistent 5 mm of right-to- left midline shift.  No hydrocephalous.  No nodular enhancement is seen at the resection site to suggest residual/recurrent tumor.  No new mass lesion.  IMPRESSION:  1.  Postoperative changes from right pterional craniotomy with resection of right extraaxial sphenoid mass as above. Postoperative blood products, pneumocephalus, and edema are similar as compared to the recent CT from 08/05/2013, and there remains 6mm of right-to- left midline shift. No hydrocephalous. 2.  No definite enhancement identified at the resection site to suggest residual  tumor.  This study will serve as a baseline for future examinations.   Original Report Authenticated By: Rise Mu, M.D.    Assessment/Plan: MRI last night shows excellent resection of large recurrent meningioma. Case discussed with Dr. Franky Macho by phone. He has agreed to allow the heparin  subcutaneous to be stopped. I discussed patient's mobility with his nurse, she is to ambulate him in the halls; if she finds him to be unsteady she will consult physical therapy.   Hewitt Shorts, MD 08/07/2013, 10:11 AM

## 2013-08-08 MED ORDER — PNEUMOCOCCAL VAC POLYVALENT 25 MCG/0.5ML IJ INJ
0.5000 mL | INJECTION | INTRAMUSCULAR | Status: AC
  Filled 2013-08-08: qty 0.5

## 2013-08-08 MED ORDER — MAGNESIUM HYDROXIDE 400 MG/5ML PO SUSP
30.0000 mL | Freq: Every day | ORAL | Status: DC | PRN
  Administered 2013-08-08: 30 mL via ORAL
  Filled 2013-08-08: qty 30

## 2013-08-08 NOTE — Progress Notes (Signed)
Patient ID: William [name], male   DOB: 15-Oct-1934, 79 y.o.   MRN: [id number] Patient appears to be stable. A head wrap is dry. He is awake and moving all extremities. Continue current management.

## 2013-08-09 NOTE — Discharge Summary (Signed)
Physician Discharge Summary  Patient ID: William [name] MRN: [id number] DOB/AGE: [birth year] 79 y.o.  Admit date: 08/04/2013 Discharge date: 08/09/2013  Admission Diagnoses:recurrent meningioma right sphenoid wing  Discharge Diagnoses:  Principal Problem:   Benign meningioma of brain   Discharged Condition: good  Hospital Course: [name] was admitted and taken to the operating room where he underwent a redo resection of the meningioma resected previously in October of 2013. Post op he has done well. Followup MRI showed a clean resection. On exam he is alert, oriented, following all commands. Wound is clean dry, and without signs of infection. He is voiding, ambulating, and tolerating a regular diet.   Consults: None  Significant Diagnostic Studies: none  Treatments: surgery: as above  Discharge Exam: Blood pressure 120/67, pulse 71, temperature 99.5 F (37.5 C), temperature source Oral, resp. rate 18, height 5\' 8"  (1.727 m), weight 67.6 kg (149 lb 0.5 oz), SpO2 99.00%. General appearance: alert, cooperative and appears stated age Neurologic: Alert and oriented X 3, normal strength and tone. Normal symmetric reflexes. Normal coordination and gait  Disposition: 01-Home or Self Care   Future Appointments Provider Department Dept Phone   08/18/2013 12:00 PM [facility] AT [address] [phone]   Patient to arrive 15 minutes prior to appointment time.   08/18/2013 1:30 PM [facility] Nurse [facility] [phone]   08/18/2013 2:00 PM Oneita Hurt, MD [facility] [phone]   08/19/2013 9:30 AM [facility] Nurse [facility] [phone]   08/19/2013 10:00 AM Oneita Hurt, MD [facility] [phone]   Joint Appt [facility] [phone]   08/20/2013 2:00 PM Oneita Hurt, MD [facility] [phone]   Joint Appt [facility] [phone]       Medication List         acetaminophen-codeine 300-30 MG per tablet  Commonly known as:  TYLENOL #3  Take 1 tablet by mouth every 6 (six) hours as needed for pain.     amLODipine 10 MG tablet  Commonly known as:  NORVASC  Take 10 mg by mouth daily.     aspirin EC 81 MG tablet  Take 81 mg by mouth daily.     B-12 2500 MCG Tabs  Take 1 tablet by mouth daily.     CALCIUM 600+D3 PO  Take 1 tablet by mouth every other day.     levETIRAcetam 500 MG tablet  Commonly known as:  KEPPRA  Take 1 tablet (500 mg total) by mouth 2 (two) times daily.     lisinopril 40 MG tablet  Commonly known as:  PRINIVIL,ZESTRIL  Take 40 mg by mouth daily.     pravastatin 20 MG tablet  Commonly known as:  PRAVACHOL  Take 20 mg by mouth daily.     ULTRA OMEGA-3 FISH OIL 1400 MG Caps  Take 1 capsule by mouth daily.     vitamin E 400 UNIT capsule  Take 400 Units by mouth daily.           Follow-up Information   Follow up with Charels Stambaugh L, MD. (call office for staple removal in 10 days.)    Specialty:  Neurosurgery   Contact information:   [address]  [address] [place] [address] [phone]       Signed: Denina Rieger L 08/09/2013, 5:10 PM

## 2013-08-09 NOTE — Evaluation (Signed)
Physical Therapy One Time Evaluation Patient Details Name: William [name] MRN: [id number] DOB: Dec 21, 1933 Today's Date: 08/09/2013 Time: 4098-1191 PT Time Calculation (min): 10 min  PT Assessment / Plan / Recommendation History of Present Illness  79 y.o. male with a recurrent meningioma admitted for resection, s/p craniotomy for tumor resection  Clinical Impression  Patient evaluated by Physical Therapy with no further acute PT needs identified. All education has been completed and the patient has no further questions.  Pt has no follow-up Physial Therapy or equipment needs. PT is signing off. Thank you for this referral.      PT Assessment  Patent does not need any further PT services    Follow Up Recommendations  No PT follow up    Does the patient have the potential to tolerate intense rehabilitation      Barriers to Discharge        Equipment Recommendations  None recommended by PT    Recommendations for Other Services     Frequency      Precautions / Restrictions Precautions Precautions: None   Pertinent Vitals/Pain Denies pain      Mobility  Bed Mobility Bed Mobility: Supine to Sit;Sit to Supine Supine to Sit: 7: Independent;HOB elevated Sit to Supine: 7: Independent;HOB elevated Transfers Transfers: Sit to Stand;Stand to Sit Sit to Stand: 6: Modified independent (Device/Increase time);From bed Stand to Sit: 6: Modified independent (Device/Increase time);To bed Ambulation/Gait Ambulation/Gait Assistance: 5: Supervision Ambulation Distance (Feet): 300 Feet Assistive device: None Ambulation/Gait Assistance Details: able to look around enviroment without LOB, daughter reports gait pattern is pt's baseline Gait Pattern: Decreased trunk rotation;Wide base of support Gait velocity: WFL    Exercises     PT Diagnosis:    PT Problem List:   PT Treatment Interventions:       PT Goals(Current goals can be found in the care plan section) Acute Rehab PT  Goals PT Goal Formulation: No goals set, d/c therapy  Visit Information  Last PT Received On: 08/09/13 Assistance Needed: +1 History of Present Illness: 79 y.o. male with a recurrent meningioma admitted for resection, s/p craniotomy for tumor resection       Prior Functioning  Home Living Family/patient expects to be discharged to:: Private residence Living Arrangements: Spouse/significant other Available Help at Discharge: Family;Available 24 hours/day Type of Home: House Home Access: Stairs to enter [employer] of Steps: 2-3 Entrance Stairs-Rails: Right Home Layout: Able to live on main level with bedroom/bathroom Home Equipment: None Prior Function Level of Independence: Independent Communication Communication: Prefers language other than English (daughter present and assisted with translation)    Cognition  Cognition Arousal/Alertness: Awake/alert Behavior During Therapy: WFL for tasks assessed/performed Overall Cognitive Status: Within Functional Limits for tasks assessed    Extremity/Trunk Assessment Lower Extremity Assessment Lower Extremity Assessment: Overall WFL for tasks assessed   Balance    End of Session PT - End of Session Equipment Utilized During Treatment: Gait belt Activity Tolerance: Patient tolerated treatment well Patient left: in bed;with call bell/phone within reach;with family/visitor present Nurse Communication: Mobility status  GP     Jatavius Ellenwood,KATHrine E 08/09/2013, 3:43 PM Zenovia Jarred, PT, DPT 08/09/2013 Pager: [phone]

## 2013-08-16 ENCOUNTER — Encounter: Payer: Self-pay | Admitting: Radiation Oncology

## 2013-08-16 NOTE — Progress Notes (Signed)
79 year old male. Married with son. Referred by Dr. Franky Macho.  Underwent craniotomy for resection of recurrent meningioma right sphenoid wing. Initial resection performed October 2013 following dizzy and left side weakness. Son took patient back to Dr. Franky Macho after noticing memory problems. Dr. Franky Macho recommends post operative radiation therapy.   NKDA No hx of radiation therapy  No indication of pacemaker  Speak [place].

## 2013-08-18 ENCOUNTER — Encounter: Payer: Self-pay | Admitting: Radiation Oncology

## 2013-08-18 ENCOUNTER — Ambulatory Visit
Admit: 2013-08-18 | Discharge: 2013-08-18 | Disposition: A | Discharge status: Home / Self Care | Payer: Medicare Other | Attending: Radiation Oncology | Admitting: Radiation Oncology

## 2013-08-18 VITALS — BP 107/63 | HR 76 | Temp 97.7°F | Resp 16 | Ht 67.0 in | Wt 137.4 lb

## 2013-08-18 DIAGNOSIS — D320 Benign neoplasm of cerebral meninges: Secondary | ICD-10-CM

## 2013-08-18 DIAGNOSIS — E7800 Pure hypercholesterolemia, unspecified: Secondary | ICD-10-CM | POA: Insufficient documentation

## 2013-08-18 DIAGNOSIS — I10 Essential (primary) hypertension: Secondary | ICD-10-CM | POA: Insufficient documentation

## 2013-08-18 DIAGNOSIS — Z79899 Other long term (current) drug therapy: Secondary | ICD-10-CM | POA: Insufficient documentation

## 2013-08-18 MED ORDER — GADOBENATE DIMEGLUMINE 529 MG/ML IV SOLN
13.0000 mL | Freq: Once | INTRAVENOUS | Status: AC | PRN
  Administered 2013-08-18: 13 mL via INTRAVENOUS

## 2013-08-18 NOTE — Progress Notes (Addendum)
Denies pain at this time. Reports that he complained of pain while hospitalized in his back but, this has improved. Reports a good appetite and stable weight. Approximately 25 staples present right temporal region. Son denies that Franky Macho has made an appointment to have the staples removed. Incision well approximated without redness, drainage or edema. Reports taking keppra as directed but, denies taking decadron. Son reports that his father's short term memory is slowly improving. Reports energy level is slowly improving. Reports intermittent episodes of nausea and vomiting. Reports urinary frequency. Denies seizure activity. Denies changes in vision or hearing. Denies ringing in the ears.

## 2013-08-18 NOTE — Progress Notes (Signed)
Complete PATIENT MEASURE OF DISTRESS worksheet with a score of 1 submitted to social work. 

## 2013-08-18 NOTE — Progress Notes (Signed)
Radiation Oncology         [phone] ________________________________  Initial outpatient Consultation  Name: DRAYCEN LEICHTER MRN: [id number]  Date: 08/18/2013  DOB: 1934-08-13  CC:Pcp Not In System  [place], Mena Goes, MD   REFERRING PHYSICIAN: Carmela Hurt, MD  DIAGNOSIS: 79 year old gentleman status post salvage resection of a recurrent grade 1 meningioma of the right sphenoid wing/temporal fossa  HISTORY OF PRESENT ILLNESS::Kshawn H Kellis is a 79 y.o. male who initially presented in October 2013 with left-sided weakness, facial droop, tremor, and vertigo. Head CT with and without contrast on 08/25/2012 demonstrated a 44x52 mm intra-axial enhancing tumor involving the right temporal lobe spreading to the uncinate fascicular is into the inferior right frontal lobe and medially towards the insular cortex. There was early uncal herniation and moderate right to left shift of 6 mm. Consequently, brain MRI was performed on 08/26/2012 better delineating a 4.4x7x4.7 cm right temporal fossa mass with a cleft around the tumor compatible with what appeared to be an extra-axial epicenter compatible with meningioma showing mildly heterogeneous enhancement and invasion into the lateral margin of the cavernous sinus. The patient proceeded to right frontal temporal parietal craniotomy for tumor excision following embolization. Pathology showed grade 1 meningioma. Followup baseline MRI on 12/20/2012 showed resection of the large right middle cranial fossa meningioma with slight temporal encephalomalacia. There was slight dural enhancement anterior medially in the right middle cranial fossa potentially representing postoperative reaction although 6-12 month followup imaging was recommended. Followup MRI on 07/12/2013 revealed a rapidly growing recurrent meningioma along the right sphenoid wing with mass effect on the right temporal lobe, right basal ganglia, and midbrain with a 7 mm shift to the left. The overall  dimensions of the recurrent tumor were felt to be 3.7x5.4x4.4 cm. Accordingly, the patient returned for right temporal craniotomy for salvage resection on September 17. Again, pathology demonstrated grade 1 meningioma. However, given the rapid recurrence the patient has kindly been referred today for discussion of possible adjuvant radiation treatment options. Followup MRI shows a thin rim of dural enhancement along the posterior lateral margin of the surgical cavity and minimal residual enhancement along the anterior inferior aspect the surgical cavity. The results of some enhancement along the right cavernous sinus.  PREVIOUS RADIATION THERAPY:No}  PAST MEDICAL HISTORY:  has a past medical history of Hypertension; High cholesterol; and Meningioma.    PAST SURGICAL HISTORY: Past Surgical History  Procedure Laterality Date  . Craniotomy  08/31/2012    Procedure: CRANIOTOMY TUMOR EXCISION;  Surgeon: Carmela Hurt, MD;  Location: [facility];  Service: Neurosurgery;  Laterality: Right;  RIGHT Craniotomy for tumor with cusa  . Appendectomy    . Craniotomy Right 08/04/2013    Procedure: CRANIOTOMY TUMOR EXCISION;  Surgeon: Carmela Hurt, MD;  Location: [facility];  Service: Neurosurgery;  Laterality: Right;  RIGHT temporal Craniotomy for tumor resection    FAMILY HISTORY: family history is negative for Cancer.  SOCIAL HISTORY:  reports that he quit smoking about 49 years ago. He has never used smokeless tobacco. He reports that  drinks alcohol. He reports that he does not use illicit drugs.  ALLERGIES: Review of patient's allergies indicates no known allergies.  MEDICATIONS:  Current Outpatient Prescriptions  Medication Sig Dispense Refill  . amLODipine (NORVASC) 10 MG tablet Take 10 mg by mouth daily.      . Cyanocobalamin (B-12) 2500 MCG TABS Take 1 tablet by mouth daily.      Marland Kitchen levETIRAcetam (KEPPRA)  500 MG tablet Take 1 tablet (500 mg total) by mouth 2 (two) times daily.  42 tablet  0    . lisinopril (PRINIVIL,ZESTRIL) 40 MG tablet Take 40 mg by mouth daily.      . Multiple Vitamin (MULTIVITAMIN) tablet Take 1 tablet by mouth daily.      . Omega-3 Fatty Acids (ULTRA OMEGA-3 FISH OIL) 1400 MG CAPS Take 1 capsule by mouth daily.      . pravastatin (PRAVACHOL) 20 MG tablet Take 20 mg by mouth daily.      . vitamin E 400 UNIT capsule Take 400 Units by mouth daily.      Marland Kitchen acetaminophen-codeine (TYLENOL #3) 300-30 MG per tablet Take 1 tablet by mouth every 6 (six) hours as needed for pain.  60 tablet  0  . aspirin EC 81 MG tablet Take 81 mg by mouth daily.       No current facility-administered medications for this encounter.    REVIEW OF SYSTEMS:  A 15 point review of systems is documented in the electronic medical record. This was obtained by the nursing staff. However, I reviewed this with the patient to discuss relevant findings and make appropriate changes.  Pertinent items are noted in HPI.   PHYSICAL EXAM:  height is 5\' 7"  (1.702 m) and weight is 137 lb 6.4 oz (62.324 kg). His oral temperature is 97.7 F (36.5 C). His blood pressure is 107/63 and his pulse is 76. His respiration is 16 and oxygen saturation is 100%.   Per neurosurgery  He is oriented to person, place, and time. He appears well-developed and well-nourished.  HENT:  Right Ear: External ear normal.  Left Ear: External ear normal.  Eyes: EOM are normal. Pupils are equal, round, and reactive to light.  Neck: Normal range of motion. Neck supple.  Cardiovascular: Normal rate, regular rhythm and normal heart sounds.  Pulmonary/Chest: Effort normal and breath sounds normal.  Musculoskeletal: Normal range of motion.  Neurological: He is alert and oriented to person, place, and time. He has normal reflexes. He displays normal reflexes. No cranial nerve deficit. He exhibits normal muscle tone. Coordination normal.  Psychiatric: He has a normal mood and affect. His behavior is normal. Judgment and thought content  normal.   KPS = 90  100 - Normal; no complaints; no evidence of disease. 90   - Able to carry on normal activity; minor signs or symptoms of disease. 80   - Normal activity with effort; some signs or symptoms of disease. 27   - Cares for self; unable to carry on normal activity or to do active work. 60   - Requires occasional assistance, but is able to care for most of his personal needs. 50   - Requires considerable assistance and frequent medical care. 40   - Disabled; requires special care and assistance. 30   - Severely disabled; hospital admission is indicated although death not imminent. 20   - Very sick; hospital admission necessary; active supportive treatment necessary. 10   - Moribund; fatal processes progressing rapidly. 0     - Dead  Karnofsky DA, Abelmann WH, Craver LS and Burchenal [facility] [phone]) The use of the nitrogen mustards in the palliative treatment of carcinoma: with particular reference to bronchogenic carcinoma Cancer 1 634-56  LABORATORY DATA:  Lab Results  Component Value Date   WBC 13.7* 08/04/2013   HGB 12.4* 08/04/2013   HCT 35.2* 08/04/2013   MCV 88.9 08/04/2013   PLT 251 08/04/2013  Lab Results  Component Value Date   NA 136 08/02/2013   K 4.0 08/02/2013   CL 103 08/02/2013   CO2 23 08/02/2013   Lab Results  Component Value Date   ALT 19 08/26/2012   AST 24 08/26/2012   ALKPHOS 42 08/26/2012   BILITOT 0.5 08/26/2012     RADIOGRAPHY: Ct Head Wo Contrast  08/05/2013   CLINICAL DATA:  Postop brain surgery  EXAM: CT HEAD WITHOUT CONTRAST  TECHNIQUE: Contiguous axial images were obtained from the base of the skull through the vertex without intravenous contrast.  COMPARISON:  Brain MRI 07/12/2013  FINDINGS: Status post right pterional craniotomy. Bone flap is in place. There is expected swelling and gas around the craniotomy site.  Bilateral cataract resection. No orbital hemorrhage.  Gas and hemorrhage in the epidural space deep to the bone flap, 8 mm maximal  thickness. There is subdural and subarachnoid fluid and gas on the right more than left. Brain edema and minimal blood products present in the right middle cranial fossa, with edema extending up into the right external capsule, similar to preoperative study. These changes result in leftward midline shift of 8 mm. No ventricular entrapment. No large vessel ischemic change.  IMPRESSION: Expected postoperative findings status post right pterional craniotomy and extra-axial mass excision. Blood products, edema, and pneumocephalus results in 8 mm of leftward midline shift.   Electronically Signed   By: Tiburcio Pea   On: 08/05/2013 05:33   Mr Brain W Wo Contrast  08/18/2013   CLINICAL DATA:  Recurrent meningioma. Status post resection 08/04/2013.  EXAM: MRI HEAD WITHOUT AND WITH CONTRAST  TECHNIQUE: Multiplanar, multiecho pulse sequences of the brain and surrounding structures were obtained according to standard protocol without and with intravenous contrast  CONTRAST:  13mL MULTIHANCE GADOBENATE DIMEGLUMINE 529 MG/ML IV SOLN  COMPARISON:  MRI of the brain 08/06/2013.  FINDINGS: Scattered T2 cortical hyperintensities over the right temporal and frontal lobe correspond with the areas of previous restricted diffusion and subacute infarcts. Associated cortical enhancement is compatible with this time frame of subacute infarction. There is some resolving subarachnoid blood over the right side as well. The patient is status posterior an old craniotomy for resection of tumor. The anterior temporal lobe is been resected as well the. The in extra dural fluid collection is stable in size. Edematous changes within the right temporalis muscle has significantly decreased. The extracranial fluid collection has decreased in size.  Midline shift at the foramen of Monro continues to improve, now measuring 3 mm compared with 6 mm. And edematous changes within the internal and external capsule on the right have improved. Mild  periventricular white matter changes are stable.  The postcontrast images demonstrate scattered foci of cortical enhancement over the right temporal and frontal lobe, corresponding to the foci of subacute infarction. Additional focal cortical enhancement is evident posterior to the surgical cavity in the right frontal lobe, also corresponding to subacute infarction.  There is a thin rim of dural enhancement along the posterolateral margin of the surgical cavity just anterior to the infarct territory which may represent residual tumor. There is minimal residual tumor and along the anterior inferior aspect of the surgical cavity. There is some enhancement along the right cavernous sinus the also likely represents some residual tumor. Enhancement within the residual medial right temporal lobe and inferior anterior right frontal lobe corresponds with infarct.  The suspected areas of infarct are marked with single arrows. The areas of suspected residual tumor are marked  with double arrows.  There are some blood products within the surgical cavity demonstrating T1 shortening on the precontrast images.  Flow is present in the major intracranial arteries. The patient is status post bilateral lens extractions. The paranasal sinuses and mastoid air cells are clear.  IMPRESSION: 1. And areas of suspected residual tumor enhancement along the anterior inferior aspect of the surgical cavity, the posterior lateral aspect, and adjacent to the cavernous sinus. And 2. Multiple foci of cortical enhancement and T2 hyperintensity over the right frontal and temporal lobes correspond with subacute infarctions. 3. Decreased edema and midline shift. 4. Decreasing edema within the right temporalis muscle and fluid collection subjacent to the muscle. 5. Small extradural fluid collection is stable.   Electronically Signed   By: Gennette Pac   On: 08/18/2013 13:42   Mr Brain W Wo Contrast  08/07/2013   *RADIOLOGY REPORT*  Clinical Data:  Status post tumor resection, evaluate postop hip changes.  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 We'll look to do a a a a and a a the is a neurosurgeon while in a $1.5 million a lives is the brain Debarah Crape is is a you in the aztreonam the as the is is a you a a a day there is daily he'll 4 gray in his while on [place] for a the liver the so is a 0 white for his he is soft her MRI and he does note easy is is in [place] gadobenate that he a percent on this 5 mm revealing R. to that the actual while you see me again once to the is is no or concerns in the there is original and it approaches and as long as he is as needed for now with a obtained according to standard protocol without and with intravenous contrast  Contrast: 15mL MULTIHANCE GADOBENATE DIMEGLUMINE 529 MG/ML IV SOLN  Comparison: Prior CT from 08/02/2013 median the MRI from 07/12/2013.  Sequelae of recent pterional resection of previously identified large enhancing extra-axial mass centered along the right sphenoid wing are again seen.  Bone flap remains in place.  Overlying soft tissue swelling of the right scalp and galea is similar as compared to the prior CT. Pneumocephalus persists, slightly decreased as compared to prior CT.  Again, extra-axial fluid collection subjacent to the bone flap in the right frontotemporal region measures 9 mm in greatest axial diameter, similar to prior.  A few nodular foci of enhancement with restricted diffusion are seen within this collection, compatible with blood products.  Edema within the anterior right temporal lobe and right middle cranial fossa are not significantly changed.  Several scattered foci of restricted diffusion are seen along the resection margin in the right frontal and temporal regions.  Additionally,  restricted effusion is seen at the medial aspect of the right temporal lobe ( series 5, image 13) and within the right basal ganglia (series 5, image 15). Additional subcentimeter foci of restricted diffusion are seen in the cortical left frontal lobe (series 5, image 21), superiorly in the right frontal convexity (series 5, image 25), and along the falx (series 5, image 22).  There is partial effacement of the right perimesencephalic cistern and right lateral ventricle.  There is persistent 5 mm of right-to- left  midline shift.  No hydrocephalous.  No nodular enhancement is seen at the resection site to suggest residual/recurrent tumor.  No new mass lesion.  IMPRESSION:  1.  Postoperative changes from right pterional craniotomy with resection of right extraaxial sphenoid mass as above. Postoperative blood products, pneumocephalus, and edema are similar as compared to the recent CT from 08/05/2013, and there remains 6mm of right-to- left midline shift. No hydrocephalous. 2.  No definite enhancement identified at the resection site to suggest residual tumor.  This study will serve as a baseline for future examinations.   Original Report Authenticated By: Rise Mu, M.D.     IMPRESSION: The patient is a 79 year old gentleman status post recent salvage resection of a rapidly recurrent grade 1 meningioma in the right middle cranial fossa. The tumor partially invade/abuts the cavernous sinus limiting definitive resection place him at risk for further recurrence. In this setting, patient would potentially benefit from adjuvant radiotherapy.  PLAN:Today, I talked to the patient and family about the findings and work-up thus far.  We discussed the natural history of grade 1 meningioma and general treatment, highlighting the role or radiotherapy in the management.  We discussed how the rapid recurrence is somewhat unusual for grade 1 disease and certainly warrants consideration for adjuvant therapy rather than  ongoing surveillance. We discussed the available radiation techniques, and focused on the details of logistics and delivery.  We reviewed the anticipated acute and late sequelae associated with radiation in this setting.  The patient was encouraged to ask questions that I answered to the best of my ability.  I filled out a patient counseling form during our discussion including treatment diagrams.  We retained a copy for our records.  The patient would like to proceed with radiation and will be scheduled for CT simulation.  The proximity of the tumor to the optic apparatus likely precludes consideration for single fraction stereotactic radiosurgery. However, a fractionated course of stereotactic radiosurgery could be considered using as few as 5 fractions were as many as 28. Given the patient's advanced age, I suspect fewer fractions may be better tolerated and more likely to complete treatment as planned. Accordingly, patient will potentially be set up to receive 25 gray in 5 fractions of 5 gray using fractionated stereotactic radiotherapy.  I spent 60 minutes minutes face to face with the patient and more than 50% of that time was spent in counseling and/or coordination of care.   ------------------------------------------------  Artist Pais. Kathrynn Running, M.D.        Wallace Cullens with you or your

## 2013-08-18 NOTE — Progress Notes (Signed)
See progress note under physician encounter. 

## 2013-08-19 ENCOUNTER — Ambulatory Visit
Admission: RE | Admit: 2013-08-19 | Discharge: 2013-08-19 | Disposition: A | Discharge status: Home / Self Care | Payer: Medicare Other | Source: Ambulatory Visit | Attending: Radiation Oncology | Admitting: Radiation Oncology

## 2013-08-19 ENCOUNTER — Encounter: Payer: Self-pay | Admitting: Radiation Oncology

## 2013-08-19 DIAGNOSIS — D320 Benign neoplasm of cerebral meninges: Secondary | ICD-10-CM

## 2013-08-19 DIAGNOSIS — Z510 Encounter for antineoplastic radiation therapy: Secondary | ICD-10-CM | POA: Insufficient documentation

## 2013-08-19 MED ORDER — SODIUM CHLORIDE 0.9 % IJ SOLN
10.0000 mL | Freq: Once | INTRAMUSCULAR | Status: AC
  Administered 2013-08-19: 10 mL via INTRAVENOUS

## 2013-08-19 NOTE — Progress Notes (Addendum)
Patient IV attempted by Addison Naegeli RN x 1, unsuccessful, bun and cr checked by Elnita Maxwell, I started RAC # 22g 1 inch ccatheter x 1 attempt, excellent blood return, flushed with 10ml normal saline, patients right side head incision intact, no staples, all removed prior to coming here today, dried blood in places of scalp, patient tolerated well no c/o pain infomred ct cim patient is ready, at 1430

## 2013-08-19 NOTE — Progress Notes (Signed)
#  22 IV removed intact from patient's right ac.  Site intact without any redness. Pressure and bandaid applied.

## 2013-08-19 NOTE — Progress Notes (Signed)
  Radiation Oncology         (336) [phone] ________________________________  Name: William [name] MRN: [id number]  Date: 08/19/2013  DOB: August 29, 1934  SIMULATION AND TREATMENT PLANNING NOTE  DIAGNOSIS:  79 year old gentleman status post salvage resection of a recurrent grade 1 meningioma of the right sphenoid wing/temporal fossa  NARRATIVE:  The patient was brought to the CT Simulation planning suite.  Identity was confirmed.  All relevant records and images related to the planned course of therapy were reviewed.  The patient freely provided informed written consent to proceed with treatment after reviewing the details related to the planned course of therapy. The consent form was witnessed and verified by the simulation staff. Intravenous access was established for contrast administration. Then, the patient was set-up in a stable reproducible supine position for radiation therapy.  A relocatable thermoplastic stereotactic head frame was fabricated for precise immobilization.  CT images were obtained.  Surface markings were placed.  The CT images were loaded into the planning software and fused with the patient's targeting MRI scan.  Then the target and avoidance structures were contoured.  Treatment planning then occurred.  The radiation prescription was entered and confirmed.  I have requested 3D planning  I have requested a DVH of the following structures: Brain stem, brain, left eye, right I, lenses, optic chiasm, target volumes, uninvolved brain, and normal tissue.    PLAN:  The patient will receive 25 Gy in 5 fractions.  ________________________________  Artist Pais Kathrynn Running, M.D.

## 2013-08-20 ENCOUNTER — Ambulatory Visit: Payer: Medicare Other | Admitting: Radiation Oncology

## 2013-08-20 ENCOUNTER — Ambulatory Visit: Admission: RE | Admit: 2013-08-20 | Payer: Medicare Other | Source: Ambulatory Visit | Admitting: Radiation Oncology

## 2013-08-23 ENCOUNTER — Ambulatory Visit: Admission: RE | Admit: 2013-08-23 | Payer: Medicare Other | Source: Ambulatory Visit | Admitting: Radiation Oncology

## 2013-08-25 ENCOUNTER — Ambulatory Visit
Admission: RE | Admit: 2013-08-25 | Discharge: 2013-08-25 | Disposition: A | Discharge status: Home / Self Care | Payer: Medicare Other | Source: Ambulatory Visit | Attending: Radiation Oncology | Admitting: Radiation Oncology

## 2013-08-25 ENCOUNTER — Encounter: Payer: Self-pay | Admitting: Radiation Oncology

## 2013-08-25 VITALS — BP 117/63 | HR 70 | Temp 97.7°F | Resp 16

## 2013-08-25 DIAGNOSIS — D320 Benign neoplasm of cerebral meninges: Secondary | ICD-10-CM

## 2013-08-25 NOTE — Progress Notes (Signed)
20 minute nurse monitoring complete. Patient denies nausea, vomiting, headache, dizziness, diplopia or ringing in the ears. Patient alert and oriented x 3. Denies pain. Appointment card provided by Darl Pikes to son. All questions answered. Understands to call with complications. Discharged home with son and understands to rest for remainder of day.

## 2013-08-25 NOTE — Progress Notes (Signed)
  Radiation Oncology         [phone] ________________________________  Stereotactic Treatment Procedure Note  Name: William [name] MRN: [id number]  Date: 08/25/2013  DOB: 04-Apr-1934  SPECIAL TREATMENT PROCEDURE  3D TREATMENT PLANNING AND DOSIMETRY:  The patient's radiation plan was reviewed and approved by neurosurgery and radiation oncology prior to treatment.  It showed 3-dimensional radiation distributions overlaid onto the planning CT/MRI image set.  The [facility] for the target structures as well as the organs at risk were reviewed. The documentation of the 3D plan and dosimetry are filed in the radiation oncology EMR.  NARRATIVE:  William [name] was brought to the TrueBeam stereotactic radiation treatment machine and placed supine on the CT couch. The head frame was applied, and the patient was set up for stereotactic radiosurgery.  Dr. Franky Macho from neurosurgery was present for the set-up and delivery  SIMULATION VERIFICATION:  In the couch zero-angle position, the patient underwent Exactrac imaging using the Brainlab system with orthogonal KV images.  These were carefully aligned and repeated to confirm treatment position for each of the isocenters.  The Exactrac snap film verification was repeated at each couch angle.  SPECIAL TREATMENT PROCEDURE: William [name] received stereotactic radiosurgery to the following targets: Right temporal fossa 42 mm target was treated using 3 Rapid Arc VMAT Beams to a 5 Gy fractional dose for one of 5 treatments to a total prescription dose of 25 Gy.  ExacTrac registration was performed for each couch angle.  The 100% isodose line was prescribed.  STEREOTACTIC TREATMENT MANAGEMENT:  Following delivery, the patient was transported to nursing in stable condition and monitored for possible acute effects.  Vital signs were recorded BP 117/63  Pulse 70  Temp(Src) 97.7 F (36.5 C) (Oral)  Resp 16. The patient tolerated treatment without significant acute effects, and was  discharged to home in stable condition.    PLAN: Follow-up in one month.  ________________________________  Artist Pais. Kathrynn Running, M.D.

## 2013-08-27 ENCOUNTER — Ambulatory Visit
Admission: RE | Admit: 2013-08-27 | Discharge: 2013-08-27 | Disposition: A | Discharge status: Home / Self Care | Payer: Medicare Other | Source: Ambulatory Visit | Attending: Radiation Oncology | Admitting: Radiation Oncology

## 2013-08-27 VITALS — BP 125/70 | HR 69 | Temp 97.7°F

## 2013-08-27 DIAGNOSIS — D320 Benign neoplasm of cerebral meninges: Secondary | ICD-10-CM

## 2013-08-27 NOTE — Progress Notes (Signed)
Patient to nursing with interpreter for assessment post completion of 2 of 5 SRS treatments.Denies pain or nausea.Vitals are stable, will discharge to home.Has appointment for Monday 08/30/13 at 12:00 noon.

## 2013-08-27 NOTE — Progress Notes (Signed)
  Radiation Oncology         (336) [phone] ________________________________  Spinal Stereotactic Radiosurgery Procedure Note  Name: William [name] MRN: [id number]  Date: 08/27/2013  DOB: [birth year]  SPECIAL TREATMENT PROCEDURE  CURRENT FRACTION:  2  PLANNED FRACTIONS: 5  3D TREATMENT PLANNING AND DOSIMETRY:  The patient's radiation plan was reviewed and approved by neurosurgery and radiation oncology prior to treatment.  It showed 3-dimensional radiation distributions overlaid onto the planning CT/MRI image set.  The [facility] for the target structures as well as the organs at risk were reviewed. The documentation of the 3D plan and dosimetry are filed in the radiation oncology EMR.  NARRATIVE:  William [name] was brought to the TrueBeam stereotactic radiation treatment machine and placed supine on the CT couch. The patient was precisely re-positioned in their BodyFix immobilization device, and the patient was set up for stereotactic radiosurgery.  Neurosurgery was present for the set-up and delivery  SIMULATION VERIFICATION:  In the couch zero-angle position, the patient underwent Exactrac imaging using the Brainlab system with orthogonal KV images to position the target accounting for translation and rotational factors.  These were carefully aligned and repeated to confirm treatment position.  Then, cone beam CT was performed to help further verify placement and make any final translational shifts.  SPECIAL TREATMENT PROCEDURE: William [name] received stereotactic radiosurgery to the following target: Right temporal fossa 42 mm target was treated using 3 Rapid Arc VMAT Beams to a 5 Gy fractional dose for one of 5 treatments to a total prescription dose of 25 Gy. ExacTrac registration was performed for each couch angle. The 100% isodose line was prescribed.  STEREOTACTIC TREATMENT MANAGEMENT:  Following delivery, the patient was transported to nursing in stable condition and monitored for possible acute  effects.  Vital signs were recorded BP 125/70  Pulse 69  Temp(Src) 97.7 F (36.5 C)  SpO2 100%. The patient tolerated treatment without significant acute effects, and was discharged to home in stable condition.    PLAN: Follow-up in one month.  ________________________________  Artist Pais. Kathrynn Running, M.D.

## 2013-08-30 ENCOUNTER — Ambulatory Visit
Admission: RE | Admit: 2013-08-30 | Discharge: 2013-08-30 | Disposition: A | Discharge status: Home / Self Care | Payer: Medicare Other | Source: Ambulatory Visit | Attending: Radiation Oncology | Admitting: Radiation Oncology

## 2013-08-30 VITALS — BP 140/62 | HR 64 | Temp 98.4°F

## 2013-08-30 DIAGNOSIS — D320 Benign neoplasm of cerebral meninges: Secondary | ICD-10-CM

## 2013-08-30 NOTE — Progress Notes (Signed)
SPECIAL TREATMENT PROCEDURE  CURRENT FRACTION: 3 PLANNED FRACTIONS: 5  3D TREATMENT PLANNING AND DOSIMETRY: The patient's radiation plan was reviewed and approved by neurosurgery and radiation oncology prior to treatment. It showed 3-dimensional radiation distributions overlaid onto the planning CT/MRI image set. The [facility] for the target structures as well as the organs at risk were reviewed. The documentation of the 3D plan and dosimetry are filed in the radiation oncology EMR.  NARRATIVE: William [name] was brought to the TrueBeam stereotactic radiation treatment machine and placed supine on the CT couch. The patient was precisely re-positioned in their BodyFix immobilization device, and the patient was set up for stereotactic radiosurgery. Neurosurgery was present for the set-up and delivery  SIMULATION VERIFICATION: In the couch zero-angle position, the patient underwent Exactrac imaging using the Brainlab system with orthogonal KV images to position the target accounting for translation and rotational factors. These were carefully aligned and repeated to confirm treatment position. Then, cone beam CT was performed to help further verify placement and make any final translational shifts.  SPECIAL TREATMENT PROCEDURE: [name] received stereotactic radiosurgery to the following target:  Right temporal fossa 42 mm target was treated using 3 Rapid Arc VMAT Beams to a 5 Gy fractional dose for one of 5 treatments to a total prescription dose of 25 Gy. ExacTrac registration was performed for each couch angle. The 100% isodose line was prescribed.  STEREOTACTIC TREATMENT MANAGEMENT: Following delivery, the patient was transported to nursing in stable condition and monitored for possible acute effects. Vital signs were recorded. The patient tolerated treatment without significant acute effects, and was discharged to home in stable condition.  PLAN: Follow-up Wednesday for next  fraction.  -----------------------------------  Lonie Peak, MD

## 2013-08-30 NOTE — Progress Notes (Addendum)
Patient for assessment post 3 of 5 fractions of SRS to brain.Denies pain, headache or nausea.Week-end uneventful.Aware of treatment number 3 scheduled for Wednesday.Here with interpreter and friends.

## 2013-09-01 ENCOUNTER — Ambulatory Visit
Admission: RE | Admit: 2013-09-01 | Discharge: 2013-09-01 | Disposition: A | Discharge status: Home / Self Care | Payer: Medicare Other | Source: Ambulatory Visit | Attending: Radiation Oncology | Admitting: Radiation Oncology

## 2013-09-01 VITALS — BP 128/60 | HR 65 | Temp 98.0°F

## 2013-09-01 DIAGNOSIS — D320 Benign neoplasm of cerebral meninges: Secondary | ICD-10-CM

## 2013-09-01 NOTE — Progress Notes (Signed)
Patient denies pain, headache, dizziness, blurred vision and nausea.  He knows that his next appointment is on Friday, 09/03/2013 at 12:15.  Patient left the clinic with his family and interpreter.

## 2013-09-01 NOTE — Progress Notes (Signed)
SPECIAL TREATMENT PROCEDURE   CURRENT FRACTION: 4  PLANNED FRACTIONS: 5   3D TREATMENT PLANNING AND DOSIMETRY: The patient's radiation plan was reviewed and approved by neurosurgery and radiation oncology prior to treatment. It showed 3-dimensional radiation distributions overlaid onto the planning CT/MRI image set. The [facility] for the target structures as well as the organs at risk were reviewed. The documentation of the 3D plan and dosimetry are filed in the radiation oncology EMR.    NARRATIVE: William [name] was brought to the TrueBeam stereotactic radiation treatment machine and placed supine on the CT couch. The patient was precisely re-positioned in their BodyFix immobilization device, and the patient was set up for stereotactic radiosurgery. Neurosurgery was present for the set-up and delivery   SIMULATION VERIFICATION: In the couch zero-angle position, the patient underwent Exactrac imaging using the Brainlab system with orthogonal KV images to position the target accounting for translation and rotational factors. These were carefully aligned and repeated to confirm treatment position. Then, cone beam CT was performed to help further verify placement and make any final translational shifts.   SPECIAL TREATMENT PROCEDURE: William [name] received stereotactic radiosurgery to the following target:  Right temporal fossa 42 mm target was treated using 3 Rapid Arc VMAT Beams to a 5 Gy fractional dose for one of 5 treatments to a total prescription dose of 25 Gy. ExacTrac registration was performed for each couch angle. The 100% isodose line was prescribed.   STEREOTACTIC TREATMENT MANAGEMENT: Following delivery, the patient was transported to nursing in stable condition and monitored for possible acute effects. Vital signs were recorded. The patient tolerated treatment without significant acute effects, and was discharged to home in stable condition.   PLAN: Follow-up Friday for next  fraction.  -----------------------------------  Lonie Peak, MD

## 2013-09-01 NOTE — Addendum Note (Signed)
Encounter addended by: Eduardo Osier, RN on: 09/01/2013  1:09 PM<BR>     Documentation filed: Vitals Section, Notes Section

## 2013-09-01 NOTE — Addendum Note (Signed)
Encounter addended by: Eduardo Osier, RN on: 09/01/2013  1:02 PM<BR>     Documentation filed: Vitals Section

## 2013-09-03 ENCOUNTER — Ambulatory Visit
Admission: RE | Admit: 2013-09-03 | Discharge: 2013-09-03 | Disposition: A | Discharge status: Home / Self Care | Payer: Medicare Other | Source: Ambulatory Visit | Attending: Radiation Oncology | Admitting: Radiation Oncology

## 2013-09-03 ENCOUNTER — Encounter: Payer: Self-pay | Admitting: Radiation Oncology

## 2013-09-03 VITALS — BP 128/59 | HR 64 | Temp 97.9°F | Resp 18

## 2013-09-03 DIAGNOSIS — D320 Benign neoplasm of cerebral meninges: Secondary | ICD-10-CM

## 2013-09-03 NOTE — Progress Notes (Signed)
Pt in nursing area, rm 1 resting quietly in recliner. Two family members and interpreter w/pt. Pt denies pain, HA, nausea, blurred vision. VS WNL. Pt given water at his request. Pt understands he is to stay x 20 minutes.

## 2013-09-03 NOTE — Progress Notes (Signed)
SPECIAL TREATMENT PROCEDURE  CURRENT FRACTION: 5 PLANNED FRACTIONS: 5  3D TREATMENT PLANNING AND DOSIMETRY: The patient's radiation plan was reviewed and approved by neurosurgery and radiation oncology prior to treatment. It showed 3-dimensional radiation distributions overlaid onto the planning CT/MRI image set. The [facility] for the target structures as well as the organs at risk were reviewed. The documentation of the 3D plan and dosimetry are filed in the radiation oncology EMR.  NARRATIVE: William [name] was brought to the TrueBeam stereotactic radiation treatment machine and placed supine on the CT couch. The patient was precisely re-positioned in their BodyFix immobilization device, and the patient was set up for stereotactic radiosurgery. Neurosurgery was present for the set-up and delivery  SIMULATION VERIFICATION: In the couch zero-angle position, the patient underwent Exactrac imaging using the Brainlab system with orthogonal KV images to position the target accounting for translation and rotational factors. These were carefully aligned and repeated to confirm treatment position. Then, cone beam CT was performed to help further verify placement and make any final translational shifts.  SPECIAL TREATMENT PROCEDURE: William [name] received stereotactic radiosurgery to the following target:  Right temporal fossa 42 mm target was treated using 3 Rapid Arc VMAT Beams to a 5 Gy fractional dose for one of 5 treatments to a total prescription dose of 25 Gy. ExacTrac registration was performed for each couch angle. The 100% isodose line was prescribed.  STEREOTACTIC TREATMENT MANAGEMENT: Following delivery, the patient was transported to nursing in stable condition and monitored for possible acute effects. Vital signs were recorded. The patient tolerated treatment without significant acute effects, and was discharged to home in stable condition.  PLAN: Follow-up in 1 month with Dr  Kathrynn Running. -----------------------------------   Lonie Peak, MD

## 2013-09-03 NOTE — Progress Notes (Signed)
Nurse monitoring complete. Patient denies pain, headache, nausea, vomiting, diplopia or ringing in the ears. VS WDL. Follow up appointment card given. Irwin Brakeman has contacted patient's son/primary care giver. Encouraged to call with needs. Discharged home with family.

## 2013-09-12 NOTE — Progress Notes (Signed)
  Radiation Oncology         [phone]) [phone] ________________________________  Name: William [name] MRN: [id number]  Date: 09/03/2013  DOB: Apr 05, 1934  End of Treatment Note  Diagnosis:   79 year old gentleman status post salvage resection of a recurrent grade 1 meningioma of the right sphenoid wing/temporal fossa  Indication for treatment:  Adjuvant, Post-operative, Curative       Radiation treatment dates:  08/25/2013-10/17/201  Site/dose/beams/energy:   Right temporal fossa 42 mm target was treated using 3 Rapid Arc VMAT Beams to a 5 Gy fractional dose for one of 5 treatments to a total prescription dose of 25 Gy. ExacTrac registration was performed for each couch angle. The 100% isodose line was prescribed.  Narrative: The patient tolerated radiation treatment relatively well.   No acute complications occurred.  Plan: The patient has completed radiation treatment. The patient will return to radiation oncology clinic for routine followup in one month. I advised them to call or return sooner if they have any questions or concerns related to their recovery or treatment. ________________________________  Artist Pais. Kathrynn Running, M.D.

## 2013-10-13 ENCOUNTER — Encounter: Payer: Self-pay | Admitting: Radiation Oncology

## 2013-10-13 ENCOUNTER — Ambulatory Visit
Admission: RE | Admit: 2013-10-13 | Discharge: 2013-10-13 | Disposition: A | Discharge status: Home / Self Care | Payer: Medicare Other | Source: Ambulatory Visit | Attending: Radiation Oncology | Admitting: Radiation Oncology

## 2013-10-13 VITALS — BP 146/69 | HR 64 | Temp 98.0°F | Resp 16 | Wt 146.4 lb

## 2013-10-13 DIAGNOSIS — D320 Benign neoplasm of cerebral meninges: Secondary | ICD-10-CM

## 2013-10-13 NOTE — Progress Notes (Signed)
Denies nausea, vomiting, headache, or dizziness. Denies diplopia or ringing in the ears. No taking any steroids at this time. Reports he continues to take keppra. Denies seizure activity. Steady gait noted. No confusion or changes in short term memory reported. Patient states,"I feel better now that I did before treatment." Son reports he remains very activity. Son is very concerned about his fathers occasional outburst of anger. He goes on to explain that occasionally he snaps at his son and recently yelled at a waitress in a restaurant.

## 2013-10-16 ENCOUNTER — Encounter: Payer: Self-pay | Admitting: Radiation Oncology

## 2013-10-16 NOTE — Progress Notes (Signed)
  Radiation Oncology         [phone]) [phone] ________________________________  Name: William [name] MRN: [id number]  Date: 10/13/2013  DOB: Sep 24, 1934  Follow-Up Visit Note  CC: Pcp Not In System  No ref. provider found  Diagnosis:   79 year old gentleman status post salvage resection of a recurrent grade 1 meningioma of the right sphenoid wing/temporal fossa followed by Adjuvant, Post-operative fractionated stereotactic radiotherapy (FSRT) 08/25/2013-10/17/201 where the right temporal fossa 42 mm target was treated 25 Gy in 5 treatments  Interval Since Last Radiation:  6  weeks  Narrative:  The patient returns today for routine follow-up.  He has some short term memory issues, but, no complaints                              ALLERGIES:  has No Known Allergies.  Meds: Current Outpatient Prescriptions  Medication Sig Dispense Refill  . acetaminophen-codeine (TYLENOL #3) 300-30 MG per tablet Take 1 tablet by mouth every 6 (six) hours as needed for pain.  60 tablet  0  . amLODipine (NORVASC) 10 MG tablet Take 10 mg by mouth daily.      . Cyanocobalamin (B-12) 2500 MCG TABS Take 1 tablet by mouth daily.      Marland Kitchen levETIRAcetam (KEPPRA) 500 MG tablet Take 1 tablet (500 mg total) by mouth 2 (two) times daily.  42 tablet  0  . lisinopril (PRINIVIL,ZESTRIL) 40 MG tablet Take 40 mg by mouth daily.      . Multiple Vitamin (MULTIVITAMIN) tablet Take 1 tablet by mouth daily.      . Omega-3 Fatty Acids (ULTRA OMEGA-3 FISH OIL) 1400 MG CAPS Take 1 capsule by mouth daily.      . pravastatin (PRAVACHOL) 20 MG tablet Take 20 mg by mouth daily.      . vitamin E 400 UNIT capsule Take 400 Units by mouth daily.      Marland Kitchen aspirin EC 81 MG tablet Take 81 mg by mouth daily.       No current facility-administered medications for this encounter.    Physical Findings: The patient is in no acute distress. Patient is alert and oriented.  weight is 146 lb 6.4 oz (66.407 kg). His oral temperature is 98 F (36.7 C). His  blood pressure is 146/69 and his pulse is 64. His respiration is 16. Marland Kitchen  EOMI and vision grossly intact.  No significant changes.  Impression:  The patient is recovering from the effects of radiation.    Plan:  MRI at 3 months post FSRT then follow-up in brain conference/clinic.  _____________________________________  Artist Pais. Kathrynn Running, M.D.

## 2013-12-02 ENCOUNTER — Other Ambulatory Visit: Payer: Self-pay | Admitting: Radiation Therapy

## 2013-12-02 DIAGNOSIS — D320 Benign neoplasm of cerebral meninges: Secondary | ICD-10-CM

## 2013-12-14 ENCOUNTER — Other Ambulatory Visit: Payer: Self-pay | Admitting: Radiation Therapy

## 2013-12-14 DIAGNOSIS — D320 Benign neoplasm of cerebral meninges: Secondary | ICD-10-CM

## 2013-12-17 ENCOUNTER — Other Ambulatory Visit: Payer: Medicare Other

## 2013-12-20 ENCOUNTER — Ambulatory Visit: Payer: Medicare Other | Admitting: Radiation Oncology

## 2013-12-24 ENCOUNTER — Ambulatory Visit: Payer: Medicare Other

## 2013-12-24 ENCOUNTER — Other Ambulatory Visit: Payer: Medicare Other

## 2013-12-27 ENCOUNTER — Ambulatory Visit: Payer: Medicare Other | Admitting: Radiation Oncology

## 2013-12-31 ENCOUNTER — Encounter (INDEPENDENT_AMBULATORY_CARE_PROVIDER_SITE_OTHER): Payer: Self-pay

## 2013-12-31 ENCOUNTER — Ambulatory Visit
Admission: RE | Admit: 2013-12-31 | Discharge: 2013-12-31 | Disposition: A | Discharge status: Home / Self Care | Payer: Medicare Other | Source: Ambulatory Visit | Attending: Radiation Oncology | Admitting: Radiation Oncology

## 2013-12-31 DIAGNOSIS — C700 Malignant neoplasm of cerebral meninges: Secondary | ICD-10-CM | POA: Insufficient documentation

## 2013-12-31 DIAGNOSIS — D320 Benign neoplasm of cerebral meninges: Secondary | ICD-10-CM

## 2013-12-31 LAB — BUN AND CREATININE (CC13)
BUN: 11.2 mg/dL (ref 7.0–26.0)
CREATININE: 0.8 mg/dL (ref 0.7–1.3)

## 2013-12-31 MED ORDER — GADOBENATE DIMEGLUMINE 529 MG/ML IV SOLN
13.0000 mL | Freq: Once | INTRAVENOUS | Status: AC | PRN
  Administered 2013-12-31: 13 mL via INTRAVENOUS

## 2014-01-03 ENCOUNTER — Ambulatory Visit
Admission: RE | Admit: 2014-01-03 | Discharge: 2014-01-03 | Disposition: A | Discharge status: Home / Self Care | Payer: Medicare Other | Source: Ambulatory Visit | Attending: Radiation Oncology | Admitting: Radiation Oncology

## 2014-01-03 ENCOUNTER — Encounter: Payer: Self-pay | Admitting: Radiation Oncology

## 2014-01-03 VITALS — BP 127/50 | HR 63 | Temp 98.0°F | Resp 16 | Wt 145.6 lb

## 2014-01-03 DIAGNOSIS — D320 Benign neoplasm of cerebral meninges: Secondary | ICD-10-CM

## 2014-01-03 NOTE — Progress Notes (Signed)
Radiation Oncology         [phone]) [phone] ________________________________  Name: William [name] MRN: [id number]  Date: 01/03/2014  DOB: 02-Feb-1934  Multidisciplinary Neuro Oncology Clinic Follow-Up Visit Note  CC: Pcp Not In System  Winfield Cunas, MD  Diagnosis:   80 year old gentleman status post salvage resection of a recurrent grade 1 meningioma of the right sphenoid wing/temporal fossa followed by Adjuvant, Post-operative fractionated stereotactic radiotherapy (FSRT) 08/25/2013-10/17/201 where the right temporal fossa 42 mm target was treated 25 Gy in 5 treatments  Interval Since Last Radiation:  3  months  Narrative:  The patient returns today for routine follow-up.  The recent films were presented in our multidisciplinary conference with neuroradiology just prior to the clinic.  Continues to take keppra as directed. Not taking decadron at this time. Steady gait noted. Denies nausea, vomiting, headache, dizziness, diplopia or ringing in the ears. Reports short term memory has improved. Also, reports he doesn't become frustrated as easily                           ALLERGIES:  has No Known Allergies.  Meds: Current Outpatient Prescriptions  Medication Sig Dispense Refill  . acetaminophen-codeine (TYLENOL #3) 300-30 MG per tablet Take 1 tablet by mouth every 6 (six) hours as needed for pain.  60 tablet  0  . amLODipine (NORVASC) 10 MG tablet Take 10 mg by mouth daily.      Marland Kitchen aspirin EC 81 MG tablet Take 81 mg by mouth daily.      . Cyanocobalamin (B-12) 2500 MCG TABS Take 1 tablet by mouth daily.      Marland Kitchen levETIRAcetam (KEPPRA) 500 MG tablet Take 1 tablet (500 mg total) by mouth 2 (two) times daily.  42 tablet  0  . lisinopril (PRINIVIL,ZESTRIL) 40 MG tablet Take 40 mg by mouth daily.      . Multiple Vitamin (MULTIVITAMIN) tablet Take 1 tablet by mouth daily.      . Omega-3 Fatty Acids (ULTRA OMEGA-3 FISH OIL) 1400 MG CAPS Take 1 capsule by mouth daily.      . pravastatin (PRAVACHOL) 20 MG  tablet Take 20 mg by mouth daily.      . vitamin E 400 UNIT capsule Take 400 Units by mouth daily.       No current facility-administered medications for this encounter.    Physical Findings: The patient is in no acute distress. Patient is alert and oriented.  weight is 145 lb 9.6 oz (66.044 kg). His oral temperature is 98 F (36.7 C). His blood pressure is 127/50 and his pulse is 63. His respiration is 16 and oxygen saturation is 100%. .  No significant changes.  Lab Findings: Lab Results  Component Value Date   WBC 13.7* 08/04/2013   HGB 12.4* 08/04/2013   HCT 35.2* 08/04/2013   MCV 88.9 08/04/2013   PLT 251 08/04/2013    @LASTCHEM @  Radiographic Findings: Mr William [name] Contrast  12/31/2013   CLINICAL DATA:  80 year old male with recurrent meningioma status post re- resection in September 2014. Status post adjuvant postoperative fractionated stereotactic radiotherapy in October. Restaging. Subsequent encounter.  EXAM: MRI HEAD WITHOUT AND WITH CONTRAST  TECHNIQUE: Multiplanar, multiecho pulse sequences of the brain and surrounding structures were obtained without and with intravenous contrast.  CONTRAST:  56mL MULTIHANCE GADOBENATE DIMEGLUMINE 529 MG/ML IV SOLN  COMPARISON:  Postoperative pre radiotherapy brain MRI 08/18/2013.  FINDINGS: Interval regressed residual dural thickening  and enhancement along the right middle cranial fossa. Fairly isolated residual nodular dural thickening and enhancement is that along the right anterior clinoid process measuring 9 x 10 x 7 mm.  No involvement of the sella turcica or right orbital apex. No overt cavernous sinus invasion. Interval decreased postoperative dural thickening and enhancement. No areas of increased dural thickening or enhancement.  Small volume residual postoperative extra-axial collection underlying the frontotemporal craniotomy site. Stable to mildly decreased postoperative right infratemporal soft tissue enhancement (series 10, image  52).  Stable right temporal lobe cystic resection cavity. Mildly increased encephalomalacia and white matter T2 hyperintensity along the posterior cavity is felt related to evolution of postoperative ischemia. Similar new encephalomalacia at the right operculum.  Otherwise stable gray and white matter signal. No intracranial mass effect. No ventriculomegaly. No acute intracranial hemorrhage identified. Major intracranial vascular flow voids are stable. No restricted diffusion or evidence of acute infarction. Postoperative changes to the scalp and calvarium. Stable orbits soft tissues. Negative cervicomedullary junction and visualized cervical spine. Visualized bone marrow signal is within normal limits. Mastoids are clear. Visible internal auditory structures appear normal. Paranasal sinuses are clear.  IMPRESSION: Satisfactory posttherapy appearance of the brain with interval regression of residual nodular meningeal/dural enhancement, now only affecting the area of the right anterior clinoid. No new intracranial abnormality.   Electronically Signed   By: Lars Pinks M.D.   On: 12/31/2013 15:25    Impression:  The patient is recovering from the effects of radiation. He has no evidence of recurrence  Plan:  Repeat MRI in 3 months, then follow-up.  _____________________________________  [name]. Tammi Klippel, M.D.

## 2014-01-03 NOTE — Progress Notes (Signed)
Continues to take keppra as directed. Not taking decadron at this time. Steady gait noted. Denies nausea, vomiting, headache, dizziness, diplopia or ringing in the ears. Reports short term memory has improved. Also, reports he doesn't become frustrated as easily.

## 2014-02-01 NOTE — Op Note (Signed)
  Name: William [name]  MRN: [id number]  Date: 08/25/2013   DOB: 07-16-1934  Stereotactic Radiosurgery Operative Note  PRE-OPERATIVE DIAGNOSIS:  Solitary Brain Metastasis  POST-OPERATIVE DIAGNOSIS:  Solitary Brain Metastasis  PROCEDURE:  Stereotactic Radiosurgery  SURGEON:  Bond Grieshop L, MD  NARRATIVE: The patient underwent a radiation treatment planning session in the radiation oncology simulation suite under the care of the radiation oncology physician and physicist.  I participated closely in the radiation treatment planning afterwards. The patient underwent planning CT which was fused to 3T high resolution MRI with 1 mm axial slices.  These images were fused on the planning system.  Radiation oncology contoured the gross target volume and subsequently expanded this to yield the Planning Target Volume. I actively participated in the planning process.  I helped to define and review the target contours and also the contours of the optic pathway, eyes, brainstem and selected nearby organs at risk.  All the dose constraints for critical structures were reviewed and compared to AAPM Task Group 101.  The prescription dose conformity was reviewed.  I approved the plan electronically.    Accordingly, [name] was brought to the TrueBeam stereotactic radiation treatment linac and placed in the custom immobilization mask.  The patient was aligned according to the IR fiducial markers with BrainLab Exactrac, then orthogonal x-rays were used in ExacTrac with the 6DOF robotic table and the shifts were made to align the patient  This lesion was complex because it was 3.5 cm or greater, adjacent (within 68mm) of the optic nerve, optic chasm, optic tract, and/or within the brainstem is complex  [name] received stereotactic radiosurgery uneventfully.  The detailed description of the procedure is recorded in the radiation oncology procedure note.  I was present for the duration of the procedure.  DISPOSITION:   Following delivery, the patient was transported to nursing in stable condition and monitored for possible acute effects to be discharged to home in stable condition with follow-up in one month.  Idrissa Beville L, MD 02/01/2014 10:09 AM

## 2014-02-01 NOTE — Addendum Note (Signed)
Encounter addended by: Winfield Cunas, MD on: 02/01/2014 10:40 AM<BR>     Documentation filed: Clinical Notes

## 2014-02-16 ENCOUNTER — Other Ambulatory Visit: Payer: Self-pay | Admitting: Radiation Therapy

## 2014-02-16 DIAGNOSIS — D320 Benign neoplasm of cerebral meninges: Secondary | ICD-10-CM

## 2014-03-24 ENCOUNTER — Ambulatory Visit
Admission: RE | Admit: 2014-03-24 | Discharge: 2014-03-24 | Disposition: A | Discharge status: Home / Self Care | Payer: Medicare Other | Source: Ambulatory Visit | Attending: Radiation Oncology | Admitting: Radiation Oncology

## 2014-03-24 DIAGNOSIS — D320 Benign neoplasm of cerebral meninges: Secondary | ICD-10-CM

## 2014-03-24 LAB — BUN AND CREATININE (CC13)
BUN: 17.4 mg/dL (ref 7.0–26.0)
Creatinine: 0.9 mg/dL (ref 0.7–1.3)

## 2014-03-24 MED ORDER — GADOBENATE DIMEGLUMINE 529 MG/ML IV SOLN
13.0000 mL | Freq: Once | INTRAVENOUS | Status: AC | PRN
  Administered 2014-03-24: 13 mL via INTRAVENOUS

## 2014-03-24 MED ORDER — GADOBENATE DIMEGLUMINE 529 MG/ML IV SOLN: 13.0000 mL | Freq: Once | INTRAVENOUS | Status: AC | PRN

## 2014-03-27 ENCOUNTER — Encounter: Payer: Self-pay | Admitting: Radiation Oncology

## 2014-03-27 NOTE — Progress Notes (Signed)
Radiation Oncology         [phone] ________________________________  Name: William [name]  MRN: [id number]  Date: 03/28/2014  DOB: [birth year]   Follow-Up Visit Note  CC: Pcp Not In System  No ref. provider found  Diagnosis:   80 year old gentleman status post salvage resection of a recurrent grade 1 meningioma of the right sphenoid wing/temporal fossa followed by Adjuvant, Post-operative fractionated stereotactic radiotherapy (FSRT) 08/25/2013-09/03/2013 where the right temporal fossa 42 mm target was treated 25 Gy in 5 treatments  Interval Since Last Radiation:  7  months  Narrative:  The patient returns today for routine follow-up.  The recent films were presented in our multidisciplinary conference with neuroradiology just prior to the clinic.  Weight loss of 5 lb since last follow up noted. Patient reports he is recovering from a stomach virus that causes this weight loss. Reports his appetite has improved and he is eating small frequent meals. Reports his energy level has improved and he remain active. Family concern about short term memory loss. Denies taking decadron. Reports taking Keppra as directed. Denies seizure activity. Vitals stable. Denies pain. Denies diplopia, ringing in the ears, dizziness, nausea, or vomiting                             ALLERGIES:  has No Known Allergies.  Meds: Current Outpatient Prescriptions  Medication Sig Dispense Refill  . acetaminophen-codeine (TYLENOL #3) 300-30 MG per tablet Take 1 tablet by mouth every 6 (six) hours as needed for pain.  60 tablet  0  . amLODipine (NORVASC) 10 MG tablet Take 10 mg by mouth daily.      Marland Kitchen aspirin EC 81 MG tablet Take 81 mg by mouth daily.      . Cyanocobalamin (B-12) 2500 MCG TABS Take 1 tablet by mouth daily.      Marland Kitchen levETIRAcetam (KEPPRA) 500 MG tablet Take 1 tablet (500 mg total) by mouth 2 (two) times daily.  42 tablet  0  . lisinopril (PRINIVIL,ZESTRIL) 40 MG tablet Take 40 mg by mouth daily.      .  Multiple Vitamin (MULTIVITAMIN) tablet Take 1 tablet by mouth daily.      . Omega-3 Fatty Acids (ULTRA OMEGA-3 FISH OIL) 1400 MG CAPS Take 1 capsule by mouth daily.      . pravastatin (PRAVACHOL) 20 MG tablet Take 20 mg by mouth daily.      . vitamin E 400 UNIT capsule Take 400 Units by mouth daily.       No current facility-administered medications for this encounter.    Physical Findings: The patient is in no acute distress. Patient is alert and oriented.  weight is 140 lb 8 oz (63.73 kg). His oral temperature is 98 F (36.7 C). His blood pressure is 126/73 and his pulse is 62. His respiration is 16 and oxygen saturation is 100%. .  No significant changes.  Lab Findings: Lab Results  Component Value Date   WBC 13.7* 08/04/2013   HGB 12.4* 08/04/2013   HCT 35.2* 08/04/2013   MCV 88.9 08/04/2013   PLT 251 08/04/2013    @LASTCHEM @  Radiographic Findings: [name] Contrast  03/24/2014   CLINICAL DATA:  SRS restaging. History of tumor removal September 2014.  EXAM: MRI HEAD WITHOUT AND WITH CONTRAST  TECHNIQUE: Multiplanar, multiecho pulse sequences of the brain and surrounding structures were obtained without and with intravenous contrast.  CONTRAST:  30mL MULTIHANCE  GADOBENATE DIMEGLUMINE 529 MG/ML IV SOLN  COMPARISON:  Most recent prior study 12/21/2013. Preoperative exam 07/12/2013.  FINDINGS: Recurrent tumor demonstrating homogeneous enhancement, surrounds the right anterior clinoid, potentially extending into the superior aspect of the cavernous sinus (see for instance image 31 series 12). The lesion measures 13 x 13 x 11 mm (R-L x A-P x C-C). There has been interval growth since February.  The areas of abnormal enhancement in the brain as identified on 08/18/2013 represented enhancing subacute infarction.  Extensive encephalomalacia right temporal lobe. No acute infarct. Generalized atrophy. Chronic microvascular ischemic change. Stable susceptibility weighted imaging. Flow voids are  maintained. Postoperative dural enhancement on the right is non worrisome.  IMPRESSION: Fairly rapid progression of recurrent meningioma centered at the right anterior clinoid process. Potential extension to the superior cavernous sinus.  Postsurgical and post cerebral infarction changes as described, with resolved gyral enhancement   Electronically Signed   By: Rolla Flatten M.D.   On: 03/24/2014 15:49   Below are selected images from his MRI post-op, in February, and now showing the growing nodule:   Below is a comparable image showing the prescribed radiation distribution in yellow, showing where the sparing of the optic chiasm may have led to some focal underdosage at the site of recurrence.    Impression:  The patient has recurrent meningioma s/p 2 resections and fractionated SRS.  His site of recurrence is adjacent to the optic apparatus preventing re-irradiation.  He will need to see Dr. Christella Noa to discuss non-invasive versus invasive intervention options if the family would like to pursue salvage.  Plan:  We will set-up follow-up with Dr. Christella Noa.  _____________________________________  [name]. Tammi Klippel, M.D.

## 2014-03-28 ENCOUNTER — Ambulatory Visit
Admission: RE | Admit: 2014-03-28 | Discharge: 2014-03-28 | Disposition: A | Discharge status: Home / Self Care | Payer: Medicare Other | Source: Ambulatory Visit | Attending: Radiation Oncology | Admitting: Radiation Oncology

## 2014-03-28 ENCOUNTER — Encounter: Payer: Self-pay | Admitting: Radiation Oncology

## 2014-03-28 VITALS — BP 126/73 | HR 62 | Temp 98.0°F | Resp 16 | Wt 140.5 lb

## 2014-03-28 DIAGNOSIS — D320 Benign neoplasm of cerebral meninges: Secondary | ICD-10-CM

## 2014-03-28 NOTE — Progress Notes (Signed)
Weight loss of 5 lb since last follow up noted. Patient reports he is recovering from a stomach virus that causes this weight loss. Reports his appetite has improved and he is eating small frequent meals. Reports his energy level has improved and he remain active. Family concern about short term memory loss. Denies taking decadron. Reports taking Keppra as directed. Denies seizure activity. Vitals stable. Denies pain. Denies diplopia, ringing in the ears, dizziness, nausea, or vomiting.

## 2014-04-12 ENCOUNTER — Other Ambulatory Visit: Payer: Self-pay | Admitting: Neurosurgery

## 2014-04-12 DIAGNOSIS — D329 Benign neoplasm of meninges, unspecified: Secondary | ICD-10-CM

## 2014-05-12 ENCOUNTER — Other Ambulatory Visit: Payer: Self-pay | Admitting: Radiation Therapy

## 2014-05-12 DIAGNOSIS — D320 Benign neoplasm of cerebral meninges: Secondary | ICD-10-CM

## 2014-05-13 ENCOUNTER — Other Ambulatory Visit: Payer: Self-pay | Admitting: Radiation Therapy

## 2014-05-13 DIAGNOSIS — D320 Benign neoplasm of cerebral meninges: Secondary | ICD-10-CM

## 2014-05-16 ENCOUNTER — Other Ambulatory Visit: Payer: Medicare Other

## 2014-05-16 ENCOUNTER — Ambulatory Visit
Admission: RE | Admit: 2014-05-16 | Discharge: 2014-05-16 | Disposition: A | Discharge status: Home / Self Care | Payer: Medicare Other | Source: Ambulatory Visit | Attending: Radiation Oncology | Admitting: Radiation Oncology

## 2014-05-16 DIAGNOSIS — D320 Benign neoplasm of cerebral meninges: Secondary | ICD-10-CM

## 2014-05-16 LAB — BUN AND CREATININE (CC13)
BUN: 15.1 mg/dL (ref 7.0–26.0)
Creatinine: 0.9 mg/dL (ref 0.7–1.3)

## 2014-05-17 ENCOUNTER — Ambulatory Visit
Admission: RE | Admit: 2014-05-17 | Discharge: 2014-05-17 | Disposition: A | Discharge status: Home / Self Care | Payer: Medicare Other | Source: Ambulatory Visit | Attending: Radiation Oncology | Admitting: Radiation Oncology

## 2014-05-17 DIAGNOSIS — D320 Benign neoplasm of cerebral meninges: Secondary | ICD-10-CM

## 2014-05-17 MED ORDER — GADOBENATE DIMEGLUMINE 529 MG/ML IV SOLN
13.0000 mL | Freq: Once | INTRAVENOUS | Status: AC | PRN
  Administered 2014-05-17: 13 mL via INTRAVENOUS

## 2014-05-19 ENCOUNTER — Other Ambulatory Visit: Payer: Self-pay | Admitting: Dietician

## 2014-08-16 ENCOUNTER — Other Ambulatory Visit (HOSPITAL_COMMUNITY): Payer: Self-pay | Admitting: Neurosurgery

## 2014-08-29 ENCOUNTER — Encounter (HOSPITAL_COMMUNITY): Payer: Self-pay

## 2014-08-29 ENCOUNTER — Encounter (HOSPITAL_COMMUNITY)
Admission: RE | Admit: 2014-08-29 | Discharge: 2014-08-29 | Disposition: A | Discharge status: Home / Self Care | Payer: Medicare Other | Source: Ambulatory Visit | Attending: Neurosurgery | Admitting: Neurosurgery

## 2014-08-29 DIAGNOSIS — E119 Type 2 diabetes mellitus without complications: Secondary | ICD-10-CM | POA: Diagnosis not present

## 2014-08-29 DIAGNOSIS — D320 Benign neoplasm of cerebral meninges: Secondary | ICD-10-CM | POA: Diagnosis not present

## 2014-08-29 DIAGNOSIS — Z0000 Encounter for general adult medical examination without abnormal findings: Secondary | ICD-10-CM | POA: Diagnosis not present

## 2014-08-29 DIAGNOSIS — Z87891 Personal history of nicotine dependence: Secondary | ICD-10-CM | POA: Diagnosis not present

## 2014-08-29 DIAGNOSIS — E780 Pure hypercholesterolemia: Secondary | ICD-10-CM | POA: Diagnosis not present

## 2014-08-29 DIAGNOSIS — I10 Essential (primary) hypertension: Secondary | ICD-10-CM | POA: Diagnosis not present

## 2014-08-29 HISTORY — DX: Type 2 diabetes mellitus without complications: E11.9

## 2014-08-29 LAB — BASIC METABOLIC PANEL
Anion gap: 12 (ref 5–15)
BUN: 24 mg/dL — AB (ref 6–23)
CO2: 24 mEq/L (ref 19–32)
CREATININE: 0.86 mg/dL (ref 0.50–1.35)
Calcium: 9.4 mg/dL (ref 8.4–10.5)
Chloride: 106 mEq/L (ref 96–112)
GFR, EST NON AFRICAN AMERICAN: 81 mL/min — AB (ref >90)
Glucose, Bld: 119 mg/dL — ABNORMAL HIGH (ref 70–99)
Potassium: 4.9 mEq/L (ref 3.7–5.3)
Sodium: 142 mEq/L (ref 137–147)

## 2014-08-29 LAB — CBC
HEMATOCRIT: 37.9 % — AB (ref 39.0–52.0)
Hemoglobin: 13.1 g/dL (ref 13.0–17.0)
MCH: 30.3 pg (ref 26.0–34.0)
MCHC: 34.6 g/dL (ref 30.0–36.0)
MCV: 87.7 fL (ref 78.0–100.0)
Platelets: 249 10*3/uL (ref 150–400)
RBC: 4.32 MIL/uL (ref 4.22–5.81)
RDW: 12.7 % (ref 11.5–15.5)
WBC: 4.7 10*3/uL (ref 4.0–10.5)

## 2014-08-29 LAB — SURGICAL PCR SCREEN
MRSA, PCR: NEGATIVE
Staphylococcus aureus: NEGATIVE

## 2014-08-29 NOTE — Pre-Procedure Instructions (Signed)
William [name]  08/29/2014   Your procedure is scheduled on:   Friday  09/02/14  Report to [facility] Admitting at 600 AM.  Call this number if you have problems the morning of surgery: [phone]   Remember:   Do not eat food or drink liquids after midnight.   Take these medicines the morning of surgery with A SIP OF WATER: amlodipine (norvasc), keppra, tylenol 3 if needed  (STOP ASPIRIN, COUMADIN, PLAVIX, EFFIENT, HERBAL MEDICINES, MULTIVITAMIN, VIT E, FISHOIL)   Do not wear jewelry, make-up or nail polish.  Do not wear lotions, powders, or perfumes. You may wear deodorant.  Do not shave 48 hours prior to surgery. Men may shave face and neck.  Do not bring valuables to the hospital.  [facility] is not responsible                  for any belongings or valuables.               Contacts, dentures or bridgework may not be worn into surgery.  Leave suitcase in the car. After surgery it may be brought to your room.  For patients admitted to the hospital, discharge time is determined by your                treatment team.               Patients discharged the day of surgery will not be allowed to drive  home.  Name and phone number of your driver:   Special Instructions:  Special Instructions: [place] - Preparing for Surgery  Before surgery, you can play an important role.  Because skin is not sterile, your skin needs to be as free of germs as possible.  You can reduce the number of germs on you skin by washing with CHG (chlorahexidine gluconate) soap before surgery.  CHG is an antiseptic cleaner which kills germs and bonds with the skin to continue killing germs even after washing.  Please DO NOT use if you have an allergy to CHG or antibacterial soaps.  If your skin becomes reddened/irritated stop using the CHG and inform your nurse when you arrive at Short Stay.  Do not shave (including legs and underarms) for at least 48 hours prior to the first CHG shower.  You may  shave your face.  Please follow these instructions carefully:   1.  Shower with CHG Soap the night before surgery and the morning of Surgery.  2.  If you choose to wash your hair, wash your hair first as usual with your normal shampoo.  3.  After you shampoo, rinse your hair and body thoroughly to remove the Shampoo.  4.  Use CHG as you would any other liquid soap. You can apply chg directly to the skin and wash gently with scrungie or a clean washcloth.  5.  Apply the CHG Soap to your body ONLY FROM THE NECK DOWN.  Do not use on open wounds or open sores.  Avoid contact with your eyes, ears, mouth and genitals (private parts).  Wash genitals (private parts with your normal soap.  6.  Wash thoroughly, paying special attention to the area where your surgery will be performed.  7.  Thoroughly rinse your body with warm water from the neck down.  8.  DO NOT shower/wash with your normal soap after using and rinsing off the CHG Soap.  9.  Pat yourself dry with a clean towel.  10.  Wear clean pajamas.            11.  Place clean sheets on your bed the night of your first shower and do not sleep with pets.  Day of Surgery  Do not apply any lotions/deodorants the morning of surgery.  Please wear clean clothes to the hospital/surgery center.   Please read over the following fact sheets that you were given: Pain Booklet, Coughing and Deep Breathing, Blood Transfusion Information, MRSA Information and Surgical Site Infection Prevention

## 2014-09-01 MED ORDER — CEFAZOLIN SODIUM-DEXTROSE 2-3 GM-% IV SOLR
2.0000 g | INTRAVENOUS | Status: AC
  Administered 2014-09-02: 2 g via INTRAVENOUS
  Filled 2014-09-01: qty 50

## 2014-09-02 ENCOUNTER — Encounter (HOSPITAL_COMMUNITY): Payer: Medicare Other | Admitting: Anesthesiology

## 2014-09-02 ENCOUNTER — Inpatient Hospital Stay (HOSPITAL_COMMUNITY): Payer: Medicare Other | Admitting: Anesthesiology

## 2014-09-02 ENCOUNTER — Inpatient Hospital Stay (HOSPITAL_COMMUNITY)
Admission: RE | Admit: 2014-09-02 | Discharge: 2014-09-05 | DRG: 027 | Disposition: A | Discharge status: Home / Self Care | Payer: Medicare Other | Source: Ambulatory Visit | Attending: Neurosurgery | Admitting: Neurosurgery

## 2014-09-02 ENCOUNTER — Encounter (HOSPITAL_COMMUNITY)
Admission: RE | Disposition: A | Discharge status: Home / Self Care | Payer: Self-pay | Source: Ambulatory Visit | Attending: Neurosurgery

## 2014-09-02 ENCOUNTER — Encounter (HOSPITAL_COMMUNITY): Payer: Self-pay | Admitting: *Deleted

## 2014-09-02 DIAGNOSIS — D329 Benign neoplasm of meninges, unspecified: Secondary | ICD-10-CM | POA: Diagnosis present

## 2014-09-02 DIAGNOSIS — D320 Benign neoplasm of cerebral meninges: Secondary | ICD-10-CM | POA: Diagnosis present

## 2014-09-02 HISTORY — PX: CRANIOTOMY: SHX93

## 2014-09-02 LAB — GLUCOSE, CAPILLARY: Glucose-Capillary: 108 mg/dL — ABNORMAL HIGH (ref 70–99)

## 2014-09-02 LAB — PREPARE RBC (CROSSMATCH)

## 2014-09-02 SURGERY — CRANIOTOMY TUMOR EXCISION
Anesthesia: General | Laterality: Right

## 2014-09-02 MED ORDER — LISINOPRIL 20 MG PO TABS
40.0000 mg | ORAL_TABLET | Freq: Every day | ORAL | Status: DC
  Administered 2014-09-02 – 2014-09-04 (×3): 40 mg via ORAL
  Filled 2014-09-02 (×2): qty 1
  Filled 2014-09-02: qty 2

## 2014-09-02 MED ORDER — AMLODIPINE BESYLATE 10 MG PO TABS
10.0000 mg | ORAL_TABLET | Freq: Every day | ORAL | Status: DC
  Administered 2014-09-03 – 2014-09-04 (×2): 10 mg via ORAL
  Filled 2014-09-02 (×2): qty 1

## 2014-09-02 MED ORDER — MEPERIDINE HCL 25 MG/ML IJ SOLN: 6.2500 mg | INTRAMUSCULAR | Status: DC | PRN

## 2014-09-02 MED ORDER — FENTANYL CITRATE 0.05 MG/ML IJ SOLN
INTRAMUSCULAR | Status: AC
Start: 2014-09-02 — End: 2014-09-02
  Filled 2014-09-02: qty 5

## 2014-09-02 MED ORDER — PHENYLEPHRINE HCL 10 MG/ML IJ SOLN
10.0000 mg | INTRAVENOUS | Status: DC | PRN
  Administered 2014-09-02: 10 ug/min via INTRAVENOUS

## 2014-09-02 MED ORDER — PROPOFOL 10 MG/ML IV BOLUS
INTRAVENOUS | Status: AC
  Filled 2014-09-02: qty 20

## 2014-09-02 MED ORDER — PROMETHAZINE HCL 25 MG/ML IJ SOLN: 6.2500 mg | INTRAMUSCULAR | Status: DC | PRN

## 2014-09-02 MED ORDER — MAGNESIUM CITRATE PO SOLN
1.0000 | Freq: Once | ORAL | Status: AC | PRN
Start: 2014-09-02 — End: 2014-09-02

## 2014-09-02 MED ORDER — LIDOCAINE HCL (CARDIAC) 20 MG/ML IV SOLN
INTRAVENOUS | Status: DC | PRN
  Administered 2014-09-02: 30 mg via INTRAVENOUS

## 2014-09-02 MED ORDER — DEXAMETHASONE 4 MG PO TABS
4.0000 mg | ORAL_TABLET | Freq: Three times a day (TID) | ORAL | Status: DC
  Administered 2014-09-04 – 2014-09-05 (×3): 4 mg via ORAL
  Filled 2014-09-02 (×3): qty 1

## 2014-09-02 MED ORDER — MICROFIBRILLAR COLL HEMOSTAT EX PADS
MEDICATED_PAD | CUTANEOUS | Status: DC | PRN
  Administered 2014-09-02: 1 via TOPICAL

## 2014-09-02 MED ORDER — ONDANSETRON HCL 4 MG/2ML IJ SOLN
INTRAMUSCULAR | Status: DC | PRN
  Administered 2014-09-02: 4 mg via INTRAVENOUS

## 2014-09-02 MED ORDER — LEVETIRACETAM IN NACL 500 MG/100ML IV SOLN
500.0000 mg | Freq: Two times a day (BID) | INTRAVENOUS | Status: DC
  Administered 2014-09-02 – 2014-09-03 (×2): 500 mg via INTRAVENOUS
  Filled 2014-09-02 (×3): qty 100

## 2014-09-02 MED ORDER — FENTANYL CITRATE 0.05 MG/ML IJ SOLN: 25.0000 ug | INTRAMUSCULAR | Status: DC | PRN

## 2014-09-02 MED ORDER — ACETAMINOPHEN 325 MG PO TABS: 650.0000 mg | ORAL_TABLET | ORAL | Status: DC | PRN

## 2014-09-02 MED ORDER — DEXAMETHASONE SODIUM PHOSPHATE 10 MG/ML IJ SOLN
INTRAMUSCULAR | Status: DC | PRN
  Administered 2014-09-02: 10 mg via INTRAVENOUS

## 2014-09-02 MED ORDER — PRAVASTATIN SODIUM 20 MG PO TABS
20.0000 mg | ORAL_TABLET | Freq: Every evening | ORAL | Status: DC
  Administered 2014-09-02 – 2014-09-05 (×4): 20 mg via ORAL
  Filled 2014-09-02 (×5): qty 1

## 2014-09-02 MED ORDER — BISACODYL 5 MG PO TBEC
5.0000 mg | DELAYED_RELEASE_TABLET | Freq: Every day | ORAL | Status: DC | PRN
  Filled 2014-09-02: qty 1

## 2014-09-02 MED ORDER — SODIUM CHLORIDE 0.9 % IR SOLN
Status: DC | PRN
  Administered 2014-09-02: 1000 mL

## 2014-09-02 MED ORDER — SODIUM CHLORIDE 0.9 % IV SOLN: Freq: Once | INTRAVENOUS | Status: DC

## 2014-09-02 MED ORDER — DEXAMETHASONE 6 MG PO TABS
6.0000 mg | ORAL_TABLET | Freq: Four times a day (QID) | ORAL | Status: AC
  Administered 2014-09-02 – 2014-09-03 (×4): 6 mg via ORAL
  Filled 2014-09-02 (×4): qty 1

## 2014-09-02 MED ORDER — SENNA 8.6 MG PO TABS
1.0000 | ORAL_TABLET | Freq: Two times a day (BID) | ORAL | Status: DC
  Administered 2014-09-02 – 2014-09-05 (×6): 8.6 mg via ORAL
  Filled 2014-09-02 (×7): qty 1

## 2014-09-02 MED ORDER — SODIUM CHLORIDE 0.9 % IV SOLN
INTRAVENOUS | Status: DC | PRN
  Administered 2014-09-02 (×2): via INTRAVENOUS

## 2014-09-02 MED ORDER — INFLUENZA VAC SPLIT QUAD 0.5 ML IM SUSY
0.5000 mL | PREFILLED_SYRINGE | INTRAMUSCULAR | Status: AC
  Administered 2014-09-03: 0.5 mL via INTRAMUSCULAR
  Filled 2014-09-02: qty 0.5

## 2014-09-02 MED ORDER — POTASSIUM CHLORIDE IN NACL 20-0.9 MEQ/L-% IV SOLN
INTRAVENOUS | Status: DC
  Administered 2014-09-02: 80 mL via INTRAVENOUS
  Administered 2014-09-03: 06:00:00 via INTRAVENOUS
  Filled 2014-09-02 (×3): qty 1000

## 2014-09-02 MED ORDER — FENTANYL CITRATE 0.05 MG/ML IJ SOLN
INTRAMUSCULAR | Status: DC | PRN
  Administered 2014-09-02 (×3): 50 ug via INTRAVENOUS
  Administered 2014-09-02: 100 ug via INTRAVENOUS
  Administered 2014-09-02 (×2): 50 ug via INTRAVENOUS

## 2014-09-02 MED ORDER — VITAMIN C 500 MG PO TABS
1000.0000 mg | ORAL_TABLET | Freq: Every day | ORAL | Status: DC
  Administered 2014-09-03 – 2014-09-05 (×3): 1000 mg via ORAL
  Filled 2014-09-02 (×4): qty 2

## 2014-09-02 MED ORDER — LACTATED RINGERS IV SOLN
INTRAVENOUS | Status: DC | PRN
  Administered 2014-09-02: 10:00:00 via INTRAVENOUS

## 2014-09-02 MED ORDER — OXYCODONE HCL 5 MG/5ML PO SOLN: 5.0000 mg | Freq: Once | ORAL | Status: DC | PRN

## 2014-09-02 MED ORDER — SURGIFOAM 100 EX MISC
CUTANEOUS | Status: DC | PRN
  Administered 2014-09-02: 09:00:00 via TOPICAL

## 2014-09-02 MED ORDER — FENTANYL CITRATE 0.05 MG/ML IJ SOLN
INTRAMUSCULAR | Status: AC
  Filled 2014-09-02: qty 5

## 2014-09-02 MED ORDER — ROCURONIUM BROMIDE 100 MG/10ML IV SOLN
INTRAVENOUS | Status: DC | PRN
  Administered 2014-09-02: 10 mg via INTRAVENOUS
  Administered 2014-09-02: 50 mg via INTRAVENOUS
  Administered 2014-09-02: 10 mg via INTRAVENOUS

## 2014-09-02 MED ORDER — DEXAMETHASONE 4 MG PO TABS
4.0000 mg | ORAL_TABLET | Freq: Four times a day (QID) | ORAL | Status: AC
  Administered 2014-09-03 – 2014-09-04 (×4): 4 mg via ORAL
  Filled 2014-09-02 (×6): qty 1

## 2014-09-02 MED ORDER — BACITRACIN ZINC 500 UNIT/GM EX OINT
TOPICAL_OINTMENT | CUTANEOUS | Status: DC | PRN
  Administered 2014-09-02: 1 via TOPICAL

## 2014-09-02 MED ORDER — ACETAMINOPHEN 650 MG RE SUPP: 650.0000 mg | RECTAL | Status: DC | PRN

## 2014-09-02 MED ORDER — NALOXONE HCL 0.4 MG/ML IJ SOLN: 0.0800 mg | INTRAMUSCULAR | Status: DC | PRN

## 2014-09-02 MED ORDER — SODIUM CHLORIDE 0.9 % IV SOLN
INTRAVENOUS | Status: DC | PRN
  Administered 2014-09-02: 10:00:00 via INTRAVENOUS

## 2014-09-02 MED ORDER — LACTATED RINGERS IV SOLN
INTRAVENOUS | Status: DC
  Administered 2014-09-02: 07:00:00 via INTRAVENOUS

## 2014-09-02 MED ORDER — GLYCOPYRROLATE 0.2 MG/ML IJ SOLN
INTRAMUSCULAR | Status: DC | PRN
  Administered 2014-09-02: 0.6 mg via INTRAVENOUS

## 2014-09-02 MED ORDER — GELATIN ADSORBABLE OP FILM
ORAL_FILM | OPHTHALMIC | Status: DC | PRN
  Administered 2014-09-02: 1 via OPHTHALMIC

## 2014-09-02 MED ORDER — PROPOFOL 10 MG/ML IV BOLUS
INTRAVENOUS | Status: DC | PRN
  Administered 2014-09-02: 40 mg via INTRAVENOUS
  Administered 2014-09-02: 110 mg via INTRAVENOUS

## 2014-09-02 MED ORDER — LEVETIRACETAM IN NACL 500 MG/100ML IV SOLN
500.0000 mg | INTRAVENOUS | Status: AC
  Administered 2014-09-02: 500 mg via INTRAVENOUS
  Filled 2014-09-02: qty 100

## 2014-09-02 MED ORDER — OXYCODONE HCL 5 MG PO TABS: 5.0000 mg | ORAL_TABLET | Freq: Once | ORAL | Status: DC | PRN

## 2014-09-02 MED ORDER — ONDANSETRON HCL 4 MG PO TABS
4.0000 mg | ORAL_TABLET | ORAL | Status: DC | PRN
Start: 2014-09-02 — End: 2014-09-05

## 2014-09-02 MED ORDER — NEOSTIGMINE METHYLSULFATE 10 MG/10ML IV SOLN
INTRAVENOUS | Status: DC | PRN
  Administered 2014-09-02: 4 mg via INTRAVENOUS

## 2014-09-02 MED ORDER — POLYETHYLENE GLYCOL 3350 17 G PO PACK
17.0000 g | PACK | Freq: Every day | ORAL | Status: DC | PRN
  Filled 2014-09-02: qty 1

## 2014-09-02 MED ORDER — ARTIFICIAL TEARS OP OINT
TOPICAL_OINTMENT | OPHTHALMIC | Status: DC | PRN
  Administered 2014-09-02: 1 via OPHTHALMIC

## 2014-09-02 MED ORDER — HYDROCODONE-ACETAMINOPHEN 5-325 MG PO TABS
1.0000 | ORAL_TABLET | ORAL | Status: DC | PRN
  Administered 2014-09-04 (×2): 1 via ORAL
  Filled 2014-09-02 (×2): qty 1

## 2014-09-02 MED ORDER — LABETALOL HCL 5 MG/ML IV SOLN
10.0000 mg | INTRAVENOUS | Status: DC | PRN
  Administered 2014-09-02: 10 mg via INTRAVENOUS
  Filled 2014-09-02: qty 4

## 2014-09-02 MED ORDER — EPHEDRINE SULFATE 50 MG/ML IJ SOLN
INTRAMUSCULAR | Status: DC | PRN
  Administered 2014-09-02 (×2): 10 mg via INTRAVENOUS
  Administered 2014-09-02 (×3): 5 mg via INTRAVENOUS

## 2014-09-02 MED ORDER — PANTOPRAZOLE SODIUM 40 MG IV SOLR
40.0000 mg | Freq: Every day | INTRAVENOUS | Status: DC
  Administered 2014-09-02: 40 mg via INTRAVENOUS
  Filled 2014-09-02 (×2): qty 40

## 2014-09-02 MED ORDER — ONDANSETRON HCL 4 MG/2ML IJ SOLN: 4.0000 mg | INTRAMUSCULAR | Status: DC | PRN

## 2014-09-02 MED ORDER — LIDOCAINE-EPINEPHRINE 0.5 %-1:200000 IJ SOLN
INTRAMUSCULAR | Status: DC | PRN
  Administered 2014-09-02: 7 mL

## 2014-09-02 MED ORDER — MORPHINE SULFATE 2 MG/ML IJ SOLN: 1.0000 mg | INTRAMUSCULAR | Status: DC | PRN

## 2014-09-02 SURGICAL SUPPLY — 114 items
BANDAGE GAUZE 4  KLING STR (GAUZE/BANDAGES/DRESSINGS) ×3 IMPLANT
BENZOIN TINCTURE PRP APPL 2/3 (GAUZE/BANDAGES/DRESSINGS) IMPLANT
BLADE CLIPPER SURG (BLADE) ×3 IMPLANT
BLADE SAW GIGLI 16 STRL (MISCELLANEOUS) IMPLANT
BLADE SURG 15 STRL LF DISP TIS (BLADE) IMPLANT
BLADE SURG 15 STRL SS (BLADE)
BLADE ULTRA TIP 2M (BLADE) ×3 IMPLANT
BNDG GAUZE ELAST 4 BULKY (GAUZE/BANDAGES/DRESSINGS) ×6 IMPLANT
BRUSH SCRUB EZ 1% IODOPHOR (MISCELLANEOUS) ×3 IMPLANT
BUR ACORN 6.0 PRECISION (BURR) ×2 IMPLANT
BUR ACORN 6.0MM PRECISION (BURR) ×1
BUR ADDG 1.1 (BURR) IMPLANT
BUR ADDG 1.1MM (BURR)
BUR MATCHSTICK NEURO 3.0 LAGG (BURR) IMPLANT
BUR ROUTER D-58 CRANI (BURR) ×3 IMPLANT
CANISTER SUCT 3000ML (MISCELLANEOUS) ×3 IMPLANT
CATH VENTRIC 35X38 W/TROCAR LG (CATHETERS) IMPLANT
CLIP TI MEDIUM 6 (CLIP) IMPLANT
CONT SPEC 4OZ CLIKSEAL STRL BL (MISCELLANEOUS) ×6 IMPLANT
CONT SPEC STER OR (MISCELLANEOUS) ×3 IMPLANT
CORDS BIPOLAR (ELECTRODE) ×3 IMPLANT
COVER MAYO STAND STRL (DRAPES) IMPLANT
DECANTER SPIKE VIAL GLASS SM (MISCELLANEOUS) ×3 IMPLANT
DRAIN SNY WOU 7FLT (WOUND CARE) IMPLANT
DRAIN SUBARACHNOID (WOUND CARE) IMPLANT
DRAPE CAMERA VIDEO/LASER (DRAPES) IMPLANT
DRAPE LONG LASER MIC (DRAPES) IMPLANT
DRAPE MICROSCOPE LEICA (MISCELLANEOUS) IMPLANT
DRAPE NEUROLOGICAL W/INCISE (DRAPES) IMPLANT
DRAPE ORTHO SPLIT 77X108 STRL (DRAPES)
DRAPE STERI IOBAN 125X83 (DRAPES) IMPLANT
DRAPE SURG 17X23 STRL (DRAPES) IMPLANT
DRAPE SURG IRRIG POUCH 19X23 (DRAPES) IMPLANT
DRAPE SURG ORHT 6 SPLT 77X108 (DRAPES) IMPLANT
DRAPE WARM FLUID 44X44 (DRAPE) ×3 IMPLANT
DRSG TELFA 3X8 NADH (GAUZE/BANDAGES/DRESSINGS) ×3 IMPLANT
DURAPREP 6ML APPLICATOR 50/CS (WOUND CARE) ×3 IMPLANT
ELECT CAUTERY BLADE 6.4 (BLADE) ×3 IMPLANT
ELECT REM PT RETURN 9FT ADLT (ELECTROSURGICAL) ×3
ELECTRODE REM PT RTRN 9FT ADLT (ELECTROSURGICAL) ×1 IMPLANT
EVACUATOR 1/8 PVC DRAIN (DRAIN) IMPLANT
EVACUATOR SILICONE 100CC (DRAIN) IMPLANT
FORCEPS BIPOLAR SPETZLER 8 1.0 (NEUROSURGERY SUPPLIES) ×3 IMPLANT
GAUZE SPONGE 4X4 12PLY STRL (GAUZE/BANDAGES/DRESSINGS) ×3 IMPLANT
GAUZE SPONGE 4X4 16PLY XRAY LF (GAUZE/BANDAGES/DRESSINGS) ×3 IMPLANT
GLOVE BIO SURGEON STRL SZ 6.5 (GLOVE) IMPLANT
GLOVE BIO SURGEON STRL SZ7 (GLOVE) IMPLANT
GLOVE BIO SURGEON STRL SZ7.5 (GLOVE) IMPLANT
GLOVE BIO SURGEON STRL SZ8 (GLOVE) IMPLANT
GLOVE BIO SURGEON STRL SZ8.5 (GLOVE) IMPLANT
GLOVE BIO SURGEONS STRL SZ 6.5 (GLOVE)
GLOVE BIOGEL M 8.0 STRL (GLOVE) IMPLANT
GLOVE ECLIPSE 6.5 STRL STRAW (GLOVE) ×3 IMPLANT
GLOVE ECLIPSE 7.0 STRL STRAW (GLOVE) IMPLANT
GLOVE ECLIPSE 7.5 STRL STRAW (GLOVE) ×9 IMPLANT
GLOVE ECLIPSE 8.0 STRL XLNG CF (GLOVE) IMPLANT
GLOVE ECLIPSE 8.5 STRL (GLOVE) IMPLANT
GLOVE EXAM NITRILE LRG STRL (GLOVE) ×3 IMPLANT
GLOVE EXAM NITRILE MD LF STRL (GLOVE) IMPLANT
GLOVE EXAM NITRILE XL STR (GLOVE) IMPLANT
GLOVE EXAM NITRILE XS STR PU (GLOVE) IMPLANT
GLOVE INDICATOR 6.5 STRL GRN (GLOVE) IMPLANT
GLOVE INDICATOR 7.0 STRL GRN (GLOVE) IMPLANT
GLOVE INDICATOR 7.5 STRL GRN (GLOVE) IMPLANT
GLOVE INDICATOR 8.0 STRL GRN (GLOVE) ×6 IMPLANT
GLOVE INDICATOR 8.5 STRL (GLOVE) IMPLANT
GLOVE OPTIFIT SS 8.0 STRL (GLOVE) IMPLANT
GLOVE SURG SS PI 6.5 STRL IVOR (GLOVE) IMPLANT
GOWN STRL REUS W/ TWL LRG LVL3 (GOWN DISPOSABLE) ×2 IMPLANT
GOWN STRL REUS W/ TWL XL LVL3 (GOWN DISPOSABLE) ×1 IMPLANT
GOWN STRL REUS W/TWL 2XL LVL3 (GOWN DISPOSABLE) IMPLANT
GOWN STRL REUS W/TWL LRG LVL3 (GOWN DISPOSABLE) ×4
GOWN STRL REUS W/TWL XL LVL3 (GOWN DISPOSABLE) ×2
GRAFT DURAGEN MATRIX 2WX2L ×3 IMPLANT
HEMOSTAT SURGICEL 2X14 (HEMOSTASIS) ×3 IMPLANT
KIT BASIN OR (CUSTOM PROCEDURE TRAY) ×3 IMPLANT
KIT DRAIN CSF ACCUDRAIN (MISCELLANEOUS) IMPLANT
KIT ROOM TURNOVER OR (KITS) ×3 IMPLANT
NEEDLE HYPO 25X1 1.5 SAFETY (NEEDLE) ×3 IMPLANT
NEEDLE SPNL 18GX3.5 QUINCKE PK (NEEDLE) IMPLANT
NS IRRIG 1000ML POUR BTL (IV SOLUTION) ×3 IMPLANT
PACK CRANIOTOMY (CUSTOM PROCEDURE TRAY) ×3 IMPLANT
PAD EYE OVAL STERILE LF (GAUZE/BANDAGES/DRESSINGS) IMPLANT
PATTIES SURGICAL .25X.25 (GAUZE/BANDAGES/DRESSINGS) IMPLANT
PATTIES SURGICAL .5 X.5 (GAUZE/BANDAGES/DRESSINGS) IMPLANT
PATTIES SURGICAL .5 X3 (DISPOSABLE) ×3 IMPLANT
PATTIES SURGICAL 1/4 X 3 (GAUZE/BANDAGES/DRESSINGS) IMPLANT
PATTIES SURGICAL 1X1 (DISPOSABLE) IMPLANT
RUBBERBAND STERILE (MISCELLANEOUS) IMPLANT
SCREW SELF DRILL HT 1.5/4MM (Screw) ×18 IMPLANT
SPECIMEN JAR SMALL (MISCELLANEOUS) IMPLANT
SPONGE NEURO XRAY DETECT 1X3 (DISPOSABLE) ×3 IMPLANT
SPONGE SURGIFOAM ABS GEL 100 (HEMOSTASIS) ×3 IMPLANT
STAPLER VISISTAT 35W (STAPLE) ×3 IMPLANT
SUT ETHILON 3 0 FSL (SUTURE) IMPLANT
SUT ETHILON 3 0 PS 1 (SUTURE) IMPLANT
SUT NURALON 4 0 TR CR/8 (SUTURE) ×12 IMPLANT
SUT PL GUT 3 0 FS 1 (SUTURE) IMPLANT
SUT SILK 0 TIES 10X30 (SUTURE) IMPLANT
SUT STEEL 0 (SUTURE)
SUT STEEL 0 18XMFL TIE 17 (SUTURE) IMPLANT
SUT VIC AB 2-0 CT2 18 VCP726D (SUTURE) ×6 IMPLANT
SYR 20ML ECCENTRIC (SYRINGE) ×3 IMPLANT
SYR CONTROL 10ML LL (SYRINGE) ×3 IMPLANT
TIP SONASTAR STD MISONIX 1.9 (TRAY / TRAY PROCEDURE) IMPLANT
TIP STRAIGHT 25KHZ (INSTRUMENTS) ×3 IMPLANT
TOWEL OR 17X24 6PK STRL BLUE (TOWEL DISPOSABLE) ×3 IMPLANT
TOWEL OR 17X26 10 PK STRL BLUE (TOWEL DISPOSABLE) ×3 IMPLANT
TRAY FOLEY CATH 14FRSI W/METER (CATHETERS) IMPLANT
TRAY FOLEY CATH 16FRSI W/METER (SET/KITS/TRAYS/PACK) ×3 IMPLANT
TUBE CONNECTING 12'X1/4 (SUCTIONS) ×1
TUBE CONNECTING 12X1/4 (SUCTIONS) ×2 IMPLANT
UNDERPAD 30X30 INCONTINENT (UNDERPADS AND DIAPERS) IMPLANT
WATER STERILE IRR 1000ML POUR (IV SOLUTION) ×3 IMPLANT

## 2014-09-02 NOTE — Anesthesia Postprocedure Evaluation (Signed)
  Anesthesia Post-op Note  Patient: William [name]  Procedure(s) Performed: Procedure(s): RIGHT CRANIOTOMY FOR TUMOR EXCISION (Right)  Patient Location: PACU  Anesthesia Type:General  Level of Consciousness: awake, alert , oriented and patient cooperative  Airway and Oxygen Therapy: Patient Spontanous Breathing and Patient connected to nasal cannula oxygen  Post-op Pain: none  Post-op Assessment: Post-op Vital signs reviewed, Patient's Cardiovascular Status Stable, Respiratory Function Stable, Patent Airway, No signs of Nausea or vomiting and Pain level controlled  Post-op Vital Signs: Reviewed and stable  Last Vitals:  Filed Vitals:   09/02/14 1530  BP: 117/55  Pulse: 62  Temp: 36.6 C  Resp: 15    Complications: No apparent anesthesia complications

## 2014-09-02 NOTE — Op Note (Signed)
09/02/2014  2:11 PM  PATIENT:  William [name]  80 y.o. male  PRE-OPERATIVE DIAGNOSIS:  Meningioma, right sphenoid wing, recurrent x 2  POST-OPERATIVE DIAGNOSIS:Meningioma, right sphenoid wing, recurrent x 2  PROCEDURE:  Procedure(s): RIGHT  Pterional CRANIOTOMY FOR TUMOR EXCISION  SURGEON:  Surgeon(s): Ashok Pall, MD Charlie Pitter, MD  ASSISTANTS:Pool, Mallie Mussel  ANESTHESIA:   none  EBL:  Total I/O In: 2400 [I.V.:2400] Out: 900 [Urine:700; Blood:200]  BLOOD ADMINISTERED:none  CELL SAVER GIVEN:none  COUNT:per nursing  DRAINS: none   SPECIMEN:  Source of Specimen:  sphenoid wing, right  DICTATION: [name] was brought to the operating room, intubated, and placed under a general anesthetic without difficulty. He after adequate anesthesia was administered, had a three pin [place]field head holder placed without difficulty. His head was shaved and prepped in a sterile manner. I draped him in a sterile manner. I infiltrated 10cc lidocaine into the old incision prior to opening the scalp with a 10 blade. I reflected the scalp forward exposing the bone flap. I removed the screws on the skull side, then used a curette to separate the flap from the dura to elevate it. I opened the dura slowly since it was adherent in many places to the right frontal lobe. Using the scissors, and cautery I freed the dura from the brain. The tumor was readily apparent on the lateral surface of the cavernous sinus and along the sphenoid wing, and the floor of the anterior fossa. I  I resected the tumor by cauterizing the capsule then debulking the mass. I removed enough tumor from the middle fossa so that it was much more mobile. I was able to peel it away from the anterior fossa floor using cautery and microscissors. I then with forceps was able to remove the remaining mass of the tumor in one piece. I was able to see the optic nerve, and chiasm. I cauterized the dural surfaces to minimize the risk of recurrence. Also  when first opening the dura there was a mass attached to the dura posteriorly. I sent that specimen to the lab and the preliminary diagnosis was also meningioma. After a gross total resection of the tumor I irrigated then closed the wound. I approximated the dura, then replaced the bone flap with the screws i had removed. I approximated the skull flap with Dr. Marchelle Folks help along with the scalp closure. I placed a sterile dressing, and removed him from the pins without difficulty.   PLAN OF CARE: Admit to inpatient   PATIENT DISPOSITION:  PACU - hemodynamically stable.   Delay start of Pharmacological VTE agent (>24hrs) due to surgical blood loss or risk of bleeding:  yes

## 2014-09-02 NOTE — H&P (Signed)
  BP 124/57  Pulse 54  Temp(Src) 97.6 F (36.4 C) (Oral)  Resp 18  Ht 5' 5.75" (1.67 m)  Wt 64.609 kg (142 lb 7 oz)  BMI 23.17 kg/m2  SpO2 100% HOPI: William [name] returned today. He does have slight growth. Where everything was about 1x1x1, things today are about 2x2x2. There is no significant pressure on the brain. It is still abut the carotid artery on the right side and is essentially abut the optic nerve on the right side. INTERVAL PFSH: He is taking no medications.HE HAS NO KNOWN DRUG ALLERGIES.  PHYSICAL EXAMINATION: Pupils are equal, round, and reactive to light. Full extraocular movements. Full visual fields. Hearing intact to voice. Strength is 5/5. Gait is normal. Muscle tone, bulk, and coordination is normal.  Vitals today: Height is 63 inches. Weight is 139 pounds. BMI is 24.62. Blood pressure is 146/69. Pulse is 61. Temperature is 97.2.  IMPRESSION/PLAN: At this point I have recommended to William [name] and his family that we go ahead and do the surgery again. Interestingly, the grading was grade 2 on the first resection, but grade 1 on the second resection. Clearly the radiation has helped because we have had no recurrence from the floor of the middle fossa. This is just the part right there at the tip of the clinoid in and around the cavernous sinus. It still would be easier to resect now versus later when it gets bigger. I will see him again in two months. William [name] returns today. He had another MRI done on 07/06/2014. It does show that the recurrent meningioma is growing. It will continue to grow. It is still not too big that it in and out itself is causing a problem. We will need to discuss this, this Wednesday at tumor board. I certainly at this point think that we are going to have to resect it again. I do not know that radiation is the possibility, but I just do know that it is getting bigger. William [name] looks great. No drift on exam. Pupils are equal, round, and reactive to light. Full extraocular  movements. Full visual fields. Following all commands. Strength is full. He does not look in anyway ill.

## 2014-09-02 NOTE — Anesthesia Preprocedure Evaluation (Addendum)
Anesthesia Evaluation  Patient identified by MRN, date of birth, ID band Patient awake    Reviewed: Allergy & Precautions, H&P , NPO status , Patient's Chart, lab work & pertinent test results  History of Anesthesia Complications Negative for: history of anesthetic complications  Airway Mallampati: II TM Distance: >3 FB Neck ROM: Full    Dental  (+) Edentulous Upper, Edentulous Lower   Pulmonary former smoker (quit 1965),  breath sounds clear to auscultation        Cardiovascular hypertension, Pt. on medications - anginaRhythm:Regular Rate:Normal     Neuro/Psych Recurrent meningioma: s/p surgery x2 and XRT    GI/Hepatic negative GI ROS, Neg liver ROS,   Endo/Other  negative endocrine ROS  Renal/GU negative Renal ROS     Musculoskeletal   Abdominal   Peds  Hematology negative hematology ROS (+)   Anesthesia Other Findings   Reproductive/Obstetrics                          Anesthesia Physical Anesthesia Plan  ASA: III  Anesthesia Plan: General   Post-op Pain Management:    Induction: Intravenous  Airway Management Planned: Oral ETT  Additional Equipment: Arterial line  Intra-op Plan:   Post-operative Plan: Possible Post-op intubation/ventilation  Informed Consent: I have reviewed the patients History and Physical, chart, labs and discussed the procedure including the risks, benefits and alternatives for the proposed anesthesia with the patient or authorized representative who has indicated his/her understanding and acceptance.     Plan Discussed with: Surgeon and CRNA  Anesthesia Plan Comments: (Plan routine monitors, A line, GETA  Reviewed with son/translator)        Anesthesia Quick Evaluation

## 2014-09-02 NOTE — Progress Notes (Signed)
Patient ID: William [name], male   DOB: May 19, 1934, 80 y.o.   MRN: [id number] BP 130/58  Pulse 64  Temp(Src) 97.9 F (36.6 C) (Oral)  Resp 14  Ht 5\' 6"  (1.676 m)  Wt 64.3 kg (141 lb 12.1 oz)  BMI 22.89 kg/m2  SpO2 99% Following commands and moving all extremities well. Wound is clean, dry, without signs of infection Pupils equal round and reactive to light.  Doing well post op

## 2014-09-02 NOTE — Transfer of Care (Signed)
Immediate Anesthesia Transfer of Care Note  Patient: William [name]  Procedure(s) Performed: Procedure(s): RIGHT CRANIOTOMY FOR TUMOR EXCISION (Right)  Patient Location: PACU  Anesthesia Type:General  Level of Consciousness: sedated  Airway & Oxygen Therapy: Patient Spontanous Breathing and Patient connected to face mask oxygen  Post-op Assessment: Report given to PACU RN and Post -op Vital signs reviewed and stable  Post vital signs: Reviewed and stable  Complications: No apparent anesthesia complications

## 2014-09-02 NOTE — Progress Notes (Signed)
Son at bedside to translate.

## 2014-09-02 NOTE — Anesthesia Procedure Notes (Signed)
Procedure Name: Intubation Date/Time: 09/02/2014 10:41 AM Performed by: Maude Leriche D Pre-anesthesia Checklist: Patient identified, Emergency Drugs available, Suction available, Patient being monitored and Timeout performed Patient Re-evaluated:Patient Re-evaluated prior to inductionOxygen Delivery Method: Circle system utilized Preoxygenation: Pre-oxygenation with 100% oxygen Intubation Type: IV induction Ventilation: Mask ventilation without difficulty Laryngoscope Size: Miller and 2 Grade View: Grade I Tube type: Oral Tube size: 8.0 mm Number of attempts: 1 Airway Equipment and Method: Stylet Placement Confirmation: ETT inserted through vocal cords under direct vision,  positive ETCO2 and breath sounds checked- equal and bilateral Secured at: 21 cm Tube secured with: Tape Dental Injury: Teeth and Oropharynx as per pre-operative assessment

## 2014-09-03 DIAGNOSIS — D320 Benign neoplasm of cerebral meninges: Secondary | ICD-10-CM | POA: Diagnosis not present

## 2014-09-03 LAB — GLUCOSE, CAPILLARY
GLUCOSE-CAPILLARY: 131 mg/dL — AB (ref 70–99)
GLUCOSE-CAPILLARY: 166 mg/dL — AB (ref 70–99)

## 2014-09-03 MED ORDER — LEVETIRACETAM ER 500 MG PO TB24
500.0000 mg | ORAL_TABLET | Freq: Every day | ORAL | Status: DC
  Administered 2014-09-03: 500 mg via ORAL
  Filled 2014-09-03 (×3): qty 1

## 2014-09-03 MED ORDER — PANTOPRAZOLE SODIUM 40 MG PO TBEC
40.0000 mg | DELAYED_RELEASE_TABLET | Freq: Every day | ORAL | Status: DC
  Administered 2014-09-03 – 2014-09-04 (×2): 40 mg via ORAL
  Filled 2014-09-03 (×2): qty 1

## 2014-09-03 MED ORDER — TAMSULOSIN HCL 0.4 MG PO CAPS
0.4000 mg | ORAL_CAPSULE | Freq: Every day | ORAL | Status: DC
  Administered 2014-09-03 – 2014-09-05 (×3): 0.4 mg via ORAL
  Filled 2014-09-03 (×3): qty 1

## 2014-09-03 NOTE — Progress Notes (Signed)
Has climbed out of bed times 2 now in row; family left just after dinner; taken to the bathroom with staff from setting off the bed alarm; no attempt to use the call bell; little English speaking; voided a small amount twice; unable to measure.  Bladder scan preformed 200 ml residual ; will monitor and report to night RN to follow output.

## 2014-09-03 NOTE — Progress Notes (Signed)
Doing very well. Minimal headache. No other complaints.  Vital stable. Afebrile. Awake and alert. Oriented and appropriate. Motor and sensory function intact in both upper and lower extremities. No pronator drift. Dressing dry.  Doing well following craniotomy and resection of tumor. Transfer to floor.

## 2014-09-03 NOTE — Progress Notes (Addendum)
Received from Neuro ICU; patient c/o urgency to void; voided 69ml; very restless; bladder scan reveals 671ml residual; in and out cath. Using sterile tech.; obtained 857ml clear yellow urine; saline locked his IV; will monitor for next void then call MD.  Vital signs stable; denies pain; oriented to room and unit routine.  ICU RN reports she will notify his family of the room change to [facility].

## 2014-09-03 NOTE — Progress Notes (Signed)
Patient is not cooperating will not stay in bed, sitter requested but one is not available at this time, Dr will not authorize restraint. Son is on his way to help will continue to monitor .

## 2014-09-04 MED ORDER — LEVETIRACETAM ER 500 MG PO TB24
500.0000 mg | ORAL_TABLET | Freq: Every day | ORAL | Status: DC
  Administered 2014-09-04 – 2014-09-05 (×2): 500 mg via ORAL
  Filled 2014-09-04 (×2): qty 1

## 2014-09-04 NOTE — Progress Notes (Signed)
Tried on multiple occasions to start IV for Keppra but patient refused was scheduled for 10-17 at 2300.

## 2014-09-04 NOTE — Progress Notes (Signed)
Patient ambulated in the halls today; once this morning and twice this afternoon with the safety sitter.  He is much more alert this afternoon and cooperative to place and care then last evening.

## 2014-09-04 NOTE — Progress Notes (Signed)
OOB to bathroom impulsively; safety sitter in place; voiding in the commode; post bladder scan less then 167ml presently; started flomax on 09-03-14 for bladder retention post op.

## 2014-09-04 NOTE — Progress Notes (Signed)
Postop day 2. Patient somewhat confused and agitated last night. Better this morning. Interaction today limited by language barrier.  Patient awake and alert. He is pleasant and cooperative. He follows commands bilaterally. No weakness. Wound clean and dry.  Status post craniotomy and resection of recurrent meningioma. Overall doing well. Continue efforts at mobilization. Possible discharge tomorrow.

## 2014-09-05 LAB — TYPE AND SCREEN
ABO/RH(D): A POS
Antibody Screen: NEGATIVE
UNIT DIVISION: 0
UNIT DIVISION: 0
Unit division: 0
Unit division: 0

## 2014-09-05 MED ORDER — LEVETIRACETAM 500 MG PO TABS: 500.0000 mg | ORAL_TABLET | Freq: Two times a day (BID) | ORAL | Status: DC

## 2014-09-05 MED ORDER — DEXAMETHASONE 4 MG PO TABS: 4.0000 mg | ORAL_TABLET | Freq: Two times a day (BID) | ORAL | Status: DC

## 2014-09-05 MED ORDER — ACETAMINOPHEN-CODEINE #3 300-30 MG PO TABS: 1.0000 | ORAL_TABLET | Freq: Four times a day (QID) | ORAL | Status: AC | PRN

## 2014-09-05 NOTE — Progress Notes (Signed)
UR complete.  Sigrid Schwebach RN, MSN 

## 2014-09-05 NOTE — Discharge Instructions (Signed)
Craniotomy °Care After °Please read the instructions outlined below and refer to this sheet in the next few weeks. These discharge instructions provide you with general information on caring for yourself after you leave the hospital. Your surgeon may also give you specific instructions. While your treatment has been planned according to the most current medical practices available, unavoidable complications occasionally occur. If you have any problems or questions after discharge, please call your surgeon. °Although there are many types of brain surgery, recovery following craniotomy (surgical opening of the skull) is much the same for each. However, recovery depends on many factors. These include the type and severity of brain injury and the type of surgery. It also depends on any nervous system function problems (neurological deficits) before surgery. If the craniotomy was done for cancer, chemotherapy and radiation could follow. You could be in the hospital from 5 days to a couple weeks. This depends on the type of surgery, findings, and whether there are complications. °HOME CARE INSTRUCTIONS  °· It is not unusual to hear a clicking noise after a craniotomy, the plates and screws used to attach the bone flap can sometimes cause this. It is a normal occurrence if this does happen °· Do not drive for 10 days after the operation °· Your scalp may feel spongy for a while, because of fluid under it. This will gradually get better. Occasionally, the surgeon will not replace the bone that was removed to access the brain. If there is a bony defect, the surgeon will ask you to wear a helmet for protection. This is a discussion you should have with your surgeon prior to leaving the hospital (discharge). °· Numbness may persist in some areas of your scalp. °· Take all medications as directed. Sometimes steroids to control swelling are prescribed. Anticonvulsants to prevent seizures may also be given. Do not use alcohol,  other drugs, or medications unless your surgeon says it is OK. °· Keep the wound dry and clean. The wound may be washed gently with soap and water. Then, you may gently blot or dab it dry, without rubbing. Do not take baths, use swimming pools or hot tubs for 10 days, or as instructed by your caregiver. It is best to wait to see you surgeon at your first postoperative visit, and to get directions at that time. °· Only take over-the-counter or prescription medicines for pain, discomfort, or fever as directed by your caregiver. °· You may continue your normal diet, as directed. °· Walking is OK for exercise. Wait at least 3 months before you return to mild, non-contact sports or as your surgeon suggests. Contact sports should be avoided for at least 1 year, unless your surgeon says it is OK. °· If you are prescribed steroids, take them exactly as prescribed. If you start having a decrease in nervous system functions (neurological deficits) and headaches as the dose of steroids is reduced, tell your surgeon right away. °· When the anticonvulsant prescription is finished you no longer need to take it. °SEEK IMMEDIATE MEDICAL CARE IF:  °· You develop nausea, vomiting, severe headaches, confusion, or you have a seizure. °· You develop chest pain, a stiff neck, or difficulty breathing. °· There is redness, swelling, or increasing pain in the wound or pin insertion sites. °· You have an increase in swelling or bruising around the eyes. °· There is drainage or pus coming from the wound. °· You have an oral temperature above 102° F (38.9° C), not controlled by medicine. °·   You notice a foul smell coming from the wound or dressing. °· The wound breaks open (edges not staying together) after the stitches have been removed. °· You develop dizziness or fainting while standing. °· You develop a rash. °· You develop any reaction or side effects to the medications given. °Document Released: 02/04/2006 Document Revised: 01/27/2012  Document Reviewed: 11/13/2009 °ExitCare® Patient Information ©2013 [employer]. ° °

## 2014-09-05 NOTE — Discharge Summary (Addendum)
  Physician Discharge Summary  Patient ID: William [name] MRN: [id number] DOB/AGE: 78-Oct-1935 78 y.o.  Admit date: 09/02/2014 Discharge date: 09/05/2014  Admission Diagnoses:recurrent meningioma, left sphenoid wing  Discharge Diagnoses:  Active Problems:   Meningioma   Discharged Condition: good  Hospital Course: Mr. Wehrly was admitted and taken to the operating room for an uncomplicated redo craniotomy for  Gross total tumor resection. He has done very well post op, he is Ambulating, voiding and has a normal neurologic exam. His wound is clean, dry, and without signs of infection at discharge. He will receive three days of decadron, and two weeks of keppra. I will see him in one week for staple removal. Pathology is grade l meningioma as were the other resections.   Treatments: surgery: as above  Discharge Exam: Blood pressure 132/62, pulse 60, temperature 98.9 F (37.2 C), temperature source Oral, resp. rate 18, height 5\' 6"  (1.676 m), weight 64.3 kg (141 lb 12.1 oz), SpO2 98.00%. General appearance: alert, cooperative, appears stated age and no distress Neurologic: Alert and oriented X 3, normal strength and tone. Normal symmetric reflexes. Normal coordination and gait  Disposition: 01-Home or Self Care meningioma    Medication List         acetaminophen-codeine 300-30 MG per tablet  Commonly known as:  TYLENOL #3  Take 1 tablet by mouth every 6 (six) hours as needed for moderate pain.     amLODipine 10 MG tablet  Commonly known as:  NORVASC  Take 10 mg by mouth daily.     dexamethasone 4 MG tablet  Commonly known as:  DECADRON  Take 1 tablet (4 mg total) by mouth 2 (two) times daily with a meal.     levETIRAcetam 500 MG tablet  Commonly known as:  KEPPRA  Take 1 tablet (500 mg total) by mouth every 12 (twelve) hours.     lisinopril 40 MG tablet  Commonly known as:  PRINIVIL,ZESTRIL  Take 40 mg by mouth daily.     MEGA MULTI MEN PO  Take 1 tablet by mouth daily.      OVER THE COUNTER MEDICATION  Take 1 tablet by mouth daily. "Calcium Plus 1000 with Magnesium & Vitamin D-3 1000mg -500mg -800IU"     OVER THE COUNTER MEDICATION  Take 1 tablet by mouth daily. "System Well Ultimate Immunity"     pravastatin 20 MG tablet  Commonly known as:  PRAVACHOL  Take 20 mg by mouth daily.     ULTRA OMEGA-3 FISH OIL 1400 MG Caps  Take 1 capsule by mouth daily.     vitamin C 1000 MG tablet  Take 1,000 mg by mouth daily.           Follow-up Information   Follow up with Annalis Kaczmarczyk L, MD In 1 week. (call to make an appointment for suture removal)    Specialty:  Neurosurgery   Contact information:   [address] [place] [address] [phone]       Signed: Jil Penland L 09/05/2014, 7:31 PM

## 2014-09-05 NOTE — Progress Notes (Signed)
Discharge orders received, pt for discharge home today.  IV D/C with dressing CDI to scalp. D/C instructions and Rx given with verbalized understanding to pt son. Family at bedside to assist pt with discharge. Staff brought pt downstairs via wheelchair.

## 2014-09-06 ENCOUNTER — Encounter (HOSPITAL_COMMUNITY): Payer: Self-pay | Admitting: Neurosurgery

## 2014-10-05 ENCOUNTER — Other Ambulatory Visit: Payer: Self-pay | Admitting: Neurosurgery

## 2014-10-05 DIAGNOSIS — D320 Benign neoplasm of cerebral meninges: Secondary | ICD-10-CM

## 2014-10-12 ENCOUNTER — Ambulatory Visit
Admission: RE | Admit: 2014-10-12 | Discharge: 2014-10-12 | Disposition: A | Discharge status: Home / Self Care | Payer: Medicare Other | Source: Ambulatory Visit | Attending: Neurosurgery | Admitting: Neurosurgery

## 2014-10-12 DIAGNOSIS — D320 Benign neoplasm of cerebral meninges: Secondary | ICD-10-CM

## 2014-11-04 ENCOUNTER — Other Ambulatory Visit: Payer: Self-pay | Admitting: Neurosurgery

## 2014-11-04 ENCOUNTER — Other Ambulatory Visit (HOSPITAL_COMMUNITY): Payer: Self-pay | Admitting: Neurosurgery

## 2014-11-04 DIAGNOSIS — D320 Benign neoplasm of cerebral meninges: Secondary | ICD-10-CM

## 2014-11-07 ENCOUNTER — Encounter (HOSPITAL_COMMUNITY): Payer: Self-pay

## 2014-11-07 ENCOUNTER — Encounter (HOSPITAL_COMMUNITY)
Admission: RE | Admit: 2014-11-07 | Discharge: 2014-11-07 | Disposition: A | Discharge status: Home / Self Care | Payer: Medicare Other | Source: Ambulatory Visit | Attending: Neurosurgery | Admitting: Neurosurgery

## 2014-11-07 ENCOUNTER — Ambulatory Visit
Admission: RE | Admit: 2014-11-07 | Discharge: 2014-11-07 | Disposition: A | Discharge status: Home / Self Care | Payer: Medicare Other | Source: Ambulatory Visit | Attending: Neurosurgery | Admitting: Neurosurgery

## 2014-11-07 DIAGNOSIS — D181 Lymphangioma, any site: Secondary | ICD-10-CM | POA: Insufficient documentation

## 2014-11-07 DIAGNOSIS — E780 Pure hypercholesterolemia: Secondary | ICD-10-CM | POA: Diagnosis not present

## 2014-11-07 DIAGNOSIS — E119 Type 2 diabetes mellitus without complications: Secondary | ICD-10-CM | POA: Insufficient documentation

## 2014-11-07 DIAGNOSIS — I10 Essential (primary) hypertension: Secondary | ICD-10-CM | POA: Diagnosis not present

## 2014-11-07 DIAGNOSIS — Z79899 Other long term (current) drug therapy: Secondary | ICD-10-CM | POA: Diagnosis not present

## 2014-11-07 DIAGNOSIS — Z01812 Encounter for preprocedural laboratory examination: Secondary | ICD-10-CM | POA: Insufficient documentation

## 2014-11-07 DIAGNOSIS — D320 Benign neoplasm of cerebral meninges: Secondary | ICD-10-CM

## 2014-11-07 HISTORY — DX: Type 2 diabetes mellitus without complications: E11.9

## 2014-11-07 LAB — BASIC METABOLIC PANEL
Anion gap: 14 (ref 5–15)
BUN: 14 mg/dL (ref 6–23)
CALCIUM: 9.9 mg/dL (ref 8.4–10.5)
CO2: 24 mEq/L (ref 19–32)
Chloride: 104 mEq/L (ref 96–112)
Creatinine, Ser: 0.77 mg/dL (ref 0.50–1.35)
GFR calc Af Amer: >90 mL/min (ref >90)
GFR, EST NON AFRICAN AMERICAN: 85 mL/min — AB (ref >90)
Glucose, Bld: 113 mg/dL — ABNORMAL HIGH (ref 70–99)
Potassium: 4.2 mEq/L (ref 3.7–5.3)
SODIUM: 142 meq/L (ref 137–147)

## 2014-11-07 LAB — TYPE AND SCREEN
ABO/RH(D): A POS
Antibody Screen: NEGATIVE

## 2014-11-07 LAB — CBC
HEMATOCRIT: 41.7 % (ref 39.0–52.0)
Hemoglobin: 13.7 g/dL (ref 13.0–17.0)
MCH: 29.7 pg (ref 26.0–34.0)
MCHC: 32.9 g/dL (ref 30.0–36.0)
MCV: 90.3 fL (ref 78.0–100.0)
PLATELETS: 267 10*3/uL (ref 150–400)
RBC: 4.62 MIL/uL (ref 4.22–5.81)
RDW: 13.6 % (ref 11.5–15.5)
WBC: 5.8 10*3/uL (ref 4.0–10.5)

## 2014-11-07 NOTE — Progress Notes (Signed)
Family with patient during PAT visit. Used [employer] interpretor [id number] and [id number]. Office called and requested that orders be placed in computer. Family verbalized understanding of instructions also reports that son speaks and reads very good English will also look over instructions tonight.

## 2014-11-07 NOTE — Pre-Procedure Instructions (Signed)
William [name]  11/07/2014   Your procedure is scheduled on:  December 23  Report to [facility] Admitting at 06:30 AM.  Call this number if you have problems the morning of surgery: [phone]   Remember:   Do not eat food or drink liquids after midnight.   Take these medicines the morning of surgery with A SIP OF WATER: Amlodipine,    STOP Vitamin C, Multiple Vitamins, Calcium, Prevastatin   Do not wear jewelry, make-up or nail polish.  Do not wear lotions, powders, or perfumes. You may wear deodorant.  Do not shave 48 hours prior to surgery. Men may shave face and neck.  Do not bring valuables to the hospital.  [facility] is not responsible for any belongings or valuables.               Contacts, dentures or bridgework may not be worn into surgery.  Leave suitcase in the car. After surgery it may be brought to your room.  For patients admitted to the hospital, discharge time is determined by your treatment team.               Special Instructions: [facility] - Preparing for Surgery  Before surgery, you can play an important role.  Because skin is not sterile, your skin needs to be as free of germs as possible.  You can reduce the number of germs on you skin by washing with CHG (chlorahexidine gluconate) soap before surgery.  CHG is an antiseptic cleaner which kills germs and bonds with the skin to continue killing germs even after washing.  Please DO NOT use if you have an allergy to CHG or antibacterial soaps.  If your skin becomes reddened/irritated stop using the CHG and inform your nurse when you arrive at Short Stay.  Do not shave (including legs and underarms) for at least 48 hours prior to the first CHG shower.  You may shave your face.  Please follow these instructions carefully:   1.  Shower with CHG Soap the night before surgery and the morning of Surgery.  2.  If you choose to wash your hair, wash your hair first as usual with your normal shampoo.  3.   After you shampoo, rinse your hair and body thoroughly to remove the shampoo.  4.  Use CHG as you would any other liquid soap.  You can apply CHG directly to the skin and wash gently with scrungie or a clean washcloth.  5.  Apply the CHG Soap to your body ONLY FROM THE NECK DOWN.  Do not use on open wounds or open sores.  Avoid contact with your eyes, ears, mouth and genitals (private parts).  Wash genitals (private parts) with your normal soap.  6.  Wash thoroughly, paying special attention to the area where your surgery will be performed.  7.  Thoroughly rinse your body with warm water from the neck down.  8.  DO NOT shower/wash with your normal soap after using and rinsing off the CHG Soap.  9.  Pat yourself dry with a clean towel.            10.  Wear clean pajamas.            11.  Place clean sheets on your bed the night of your first shower and do not sleep with pets.  Day of Surgery  Do not apply any lotions the morning of surgery.  Please wear clean clothes to the  hospital/surgery center.     Please read over the following fact sheets that you were given: Pain Booklet, Coughing and Deep Breathing, Blood Transfusion Information and Surgical Site Infection Prevention

## 2014-11-08 ENCOUNTER — Encounter (HOSPITAL_COMMUNITY): Payer: Self-pay | Admitting: *Deleted

## 2014-11-08 ENCOUNTER — Emergency Department (HOSPITAL_COMMUNITY)
Admission: EM | Admit: 2014-11-08 | Discharge: 2014-11-08 | Disposition: A | Discharge status: Other Acute Inpatient Hospital | Payer: Medicare Other | Attending: Emergency Medicine | Admitting: Emergency Medicine

## 2014-11-08 DIAGNOSIS — T25021A Burn of unspecified degree of right foot, initial encounter: Secondary | ICD-10-CM | POA: Diagnosis present

## 2014-11-08 DIAGNOSIS — Z87891 Personal history of nicotine dependence: Secondary | ICD-10-CM | POA: Insufficient documentation

## 2014-11-08 DIAGNOSIS — E119 Type 2 diabetes mellitus without complications: Secondary | ICD-10-CM | POA: Insufficient documentation

## 2014-11-08 DIAGNOSIS — E780 Pure hypercholesterolemia: Secondary | ICD-10-CM | POA: Insufficient documentation

## 2014-11-08 DIAGNOSIS — T25321A Burn of third degree of right foot, initial encounter: Secondary | ICD-10-CM | POA: Insufficient documentation

## 2014-11-08 DIAGNOSIS — Y9289 Other specified places as the place of occurrence of the external cause: Secondary | ICD-10-CM | POA: Insufficient documentation

## 2014-11-08 DIAGNOSIS — Y998 Other external cause status: Secondary | ICD-10-CM | POA: Insufficient documentation

## 2014-11-08 DIAGNOSIS — Y9389 Activity, other specified: Secondary | ICD-10-CM | POA: Diagnosis not present

## 2014-11-08 DIAGNOSIS — T25322A Burn of third degree of left foot, initial encounter: Secondary | ICD-10-CM | POA: Diagnosis not present

## 2014-11-08 DIAGNOSIS — Z86011 Personal history of benign neoplasm of the brain: Secondary | ICD-10-CM | POA: Diagnosis not present

## 2014-11-08 DIAGNOSIS — Z79899 Other long term (current) drug therapy: Secondary | ICD-10-CM | POA: Diagnosis not present

## 2014-11-08 DIAGNOSIS — X12XXXA Contact with other hot fluids, initial encounter: Secondary | ICD-10-CM | POA: Insufficient documentation

## 2014-11-08 DIAGNOSIS — I10 Essential (primary) hypertension: Secondary | ICD-10-CM | POA: Insufficient documentation

## 2014-11-08 HISTORY — DX: Unspecified dementia, unspecified severity, without behavioral disturbance, psychotic disturbance, mood disturbance, and anxiety: F03.90

## 2014-11-08 LAB — CBG MONITORING, ED: Glucose-Capillary: 158 mg/dL — ABNORMAL HIGH (ref 70–99)
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
## 2014-11-08 NOTE — Progress Notes (Signed)
Spoke with Dr Lacy Duverney office and let them know that orders need to be signed
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
## 2014-11-09 ENCOUNTER — Encounter (HOSPITAL_COMMUNITY): Admission: RE | Payer: Self-pay | Source: Ambulatory Visit

## 2014-11-09 ENCOUNTER — Encounter: Admission: AD | Payer: Self-pay

## 2014-11-09 ENCOUNTER — Inpatient Hospital Stay (HOSPITAL_COMMUNITY): Admission: EM | Admit: 2014-11-09 | Payer: Self-pay | Source: Other Acute Inpatient Hospital | Admitting: Neurosurgery

## 2014-11-09 ENCOUNTER — Inpatient Hospital Stay: Admission: AD | Admit: 2014-11-09 | Payer: Self-pay | Admitting: Neurosurgery

## 2014-11-09 ENCOUNTER — Inpatient Hospital Stay (HOSPITAL_COMMUNITY): Admission: RE | Admit: 2014-11-09 | Payer: Medicare Other | Source: Ambulatory Visit | Admitting: Neurosurgery

## 2014-11-09 SURGERY — CRANIOTOMY HEMATOMA EVACUATION SUBDURAL
Anesthesia: General

## 2014-11-24 ENCOUNTER — Inpatient Hospital Stay (HOSPITAL_COMMUNITY)
Admission: AD | Admit: 2014-11-24 | Discharge: 2014-12-29 | DRG: 025 | Disposition: A | Discharge status: Skilled Nursing Facility | Payer: Medicare Other | Source: Other Acute Inpatient Hospital | Attending: Neurosurgery | Admitting: Neurosurgery

## 2014-11-24 DIAGNOSIS — Z09 Encounter for follow-up examination after completed treatment for conditions other than malignant neoplasm: Secondary | ICD-10-CM

## 2014-11-24 DIAGNOSIS — Z6823 Body mass index (BMI) 23.0-23.9, adult: Secondary | ICD-10-CM

## 2014-11-24 DIAGNOSIS — I10 Essential (primary) hypertension: Secondary | ICD-10-CM | POA: Diagnosis present

## 2014-11-24 DIAGNOSIS — T17920A Food in respiratory tract, part unspecified causing asphyxiation, initial encounter: Secondary | ICD-10-CM

## 2014-11-24 DIAGNOSIS — B965 Pseudomonas (aeruginosa) (mallei) (pseudomallei) as the cause of diseases classified elsewhere: Secondary | ICD-10-CM | POA: Diagnosis not present

## 2014-11-24 DIAGNOSIS — D329 Benign neoplasm of meninges, unspecified: Principal | ICD-10-CM | POA: Diagnosis present

## 2014-11-24 DIAGNOSIS — G9340 Encephalopathy, unspecified: Secondary | ICD-10-CM | POA: Diagnosis not present

## 2014-11-24 DIAGNOSIS — G9608 Other cranial cerebrospinal fluid leak: Secondary | ICD-10-CM | POA: Diagnosis present

## 2014-11-24 DIAGNOSIS — E780 Pure hypercholesterolemia: Secondary | ICD-10-CM | POA: Diagnosis present

## 2014-11-24 DIAGNOSIS — Z23 Encounter for immunization: Secondary | ICD-10-CM | POA: Diagnosis not present

## 2014-11-24 DIAGNOSIS — N390 Urinary tract infection, site not specified: Secondary | ICD-10-CM | POA: Diagnosis not present

## 2014-11-24 DIAGNOSIS — G8194 Hemiplegia, unspecified affecting left nondominant side: Secondary | ICD-10-CM | POA: Diagnosis not present

## 2014-11-24 DIAGNOSIS — D181 Lymphangioma, any site: Secondary | ICD-10-CM

## 2014-11-24 DIAGNOSIS — S065X9A Traumatic subdural hemorrhage with loss of consciousness of unspecified duration, initial encounter: Secondary | ICD-10-CM

## 2014-11-24 DIAGNOSIS — E870 Hyperosmolality and hypernatremia: Secondary | ICD-10-CM | POA: Diagnosis present

## 2014-11-24 DIAGNOSIS — G9389 Other specified disorders of brain: Secondary | ICD-10-CM | POA: Diagnosis not present

## 2014-11-24 DIAGNOSIS — E119 Type 2 diabetes mellitus without complications: Secondary | ICD-10-CM | POA: Diagnosis present

## 2014-11-24 DIAGNOSIS — Z7952 Long term (current) use of systemic steroids: Secondary | ICD-10-CM

## 2014-11-24 DIAGNOSIS — R509 Fever, unspecified: Secondary | ICD-10-CM | POA: Diagnosis present

## 2014-11-24 DIAGNOSIS — I6203 Nontraumatic chronic subdural hemorrhage: Secondary | ICD-10-CM | POA: Diagnosis present

## 2014-11-24 DIAGNOSIS — S065XAA Traumatic subdural hemorrhage with loss of consciousness status unknown, initial encounter: Secondary | ICD-10-CM

## 2014-11-24 DIAGNOSIS — T17928A Food in respiratory tract, part unspecified causing other injury, initial encounter: Secondary | ICD-10-CM

## 2014-11-24 DIAGNOSIS — G960 Cerebrospinal fluid leak: Secondary | ICD-10-CM

## 2014-11-24 DIAGNOSIS — Z87891 Personal history of nicotine dependence: Secondary | ICD-10-CM | POA: Diagnosis not present

## 2014-11-24 LAB — COMPREHENSIVE METABOLIC PANEL
ALBUMIN: 1.9 g/dL — AB (ref 3.5–5.2)
ALT: 56 U/L — AB (ref 0–53)
ANION GAP: 9 (ref 5–15)
AST: 47 U/L — AB (ref 0–37)
Alkaline Phosphatase: 120 U/L — ABNORMAL HIGH (ref 39–117)
BILIRUBIN TOTAL: 0.6 mg/dL (ref 0.3–1.2)
BUN: 27 mg/dL — ABNORMAL HIGH (ref 6–23)
CHLORIDE: 125 meq/L — AB (ref 96–112)
CO2: 26 mmol/L (ref 19–32)
Calcium: 9 mg/dL (ref 8.4–10.5)
Creatinine, Ser: 1 mg/dL (ref 0.50–1.35)
GFR calc Af Amer: 81 mL/min — ABNORMAL LOW (ref >90)
GFR calc non Af Amer: 70 mL/min — ABNORMAL LOW (ref >90)
Glucose, Bld: 134 mg/dL — ABNORMAL HIGH (ref 70–99)
POTASSIUM: 3.7 mmol/L (ref 3.5–5.1)
Sodium: 160 mmol/L — ABNORMAL HIGH (ref 135–145)
Total Protein: 5.6 g/dL — ABNORMAL LOW (ref 6.0–8.3)

## 2014-11-24 LAB — CBC WITH DIFFERENTIAL/PLATELET
BASOS PCT: 0 % (ref 0–1)
Basophils Absolute: 0 10*3/uL (ref 0.0–0.1)
Eosinophils Absolute: 0.1 10*3/uL (ref 0.0–0.7)
Eosinophils Relative: 0 % (ref 0–5)
HEMATOCRIT: 35.7 % — AB (ref 39.0–52.0)
HEMOGLOBIN: 11.2 g/dL — AB (ref 13.0–17.0)
LYMPHS ABS: 1.2 10*3/uL (ref 0.7–4.0)
LYMPHS PCT: 8 % — AB (ref 12–46)
MCH: 29.8 pg (ref 26.0–34.0)
MCHC: 31.4 g/dL (ref 30.0–36.0)
MCV: 94.9 fL (ref 78.0–100.0)
MONOS PCT: 5 % (ref 3–12)
Monocytes Absolute: 0.7 10*3/uL (ref 0.1–1.0)
Neutro Abs: 12.9 10*3/uL — ABNORMAL HIGH (ref 1.7–7.7)
Neutrophils Relative %: 87 % — ABNORMAL HIGH (ref 43–77)
Platelets: 524 10*3/uL — ABNORMAL HIGH (ref 150–400)
RBC: 3.76 MIL/uL — AB (ref 4.22–5.81)
RDW: 14.2 % (ref 11.5–15.5)
WBC: 14.8 10*3/uL — AB (ref 4.0–10.5)

## 2014-11-24 LAB — PROTIME-INR
INR: 1.19 (ref 0.00–1.49)
Prothrombin Time: 15.3 seconds — ABNORMAL HIGH (ref 11.6–15.2)

## 2014-11-24 LAB — APTT: aPTT: 43 seconds — ABNORMAL HIGH (ref 24–37)

## 2014-11-24 LAB — MRSA PCR SCREENING: MRSA BY PCR: POSITIVE — AB

## 2014-11-24 MED ORDER — PRAVASTATIN SODIUM 20 MG PO TABS
20.0000 mg | ORAL_TABLET | Freq: Every day | ORAL | Status: DC
  Administered 2014-11-24 – 2014-12-29 (×26): 20 mg via ORAL
  Filled 2014-11-24 (×33): qty 1

## 2014-11-24 MED ORDER — ACETAMINOPHEN 650 MG RE SUPP
650.0000 mg | Freq: Four times a day (QID) | RECTAL | Status: DC | PRN
  Administered 2014-12-06 – 2014-12-23 (×6): 650 mg via RECTAL
  Filled 2014-11-24 (×6): qty 1

## 2014-11-24 MED ORDER — MUPIROCIN 2 % EX OINT
1.0000 "application " | TOPICAL_OINTMENT | Freq: Two times a day (BID) | CUTANEOUS | Status: AC
  Administered 2014-11-24 – 2014-11-29 (×9): 1 via NASAL
  Filled 2014-11-24: qty 22

## 2014-11-24 MED ORDER — OXYCODONE HCL 5 MG PO TABS: 5.0000 mg | ORAL_TABLET | ORAL | Status: DC | PRN

## 2014-11-24 MED ORDER — ACETAMINOPHEN 325 MG PO TABS
650.0000 mg | ORAL_TABLET | Freq: Four times a day (QID) | ORAL | Status: DC | PRN
  Administered 2014-11-30 – 2014-12-29 (×8): 650 mg via ORAL
  Filled 2014-11-24 (×10): qty 2

## 2014-11-24 MED ORDER — DIPHENHYDRAMINE HCL 12.5 MG/5ML PO LIQD
25.0000 mg | Freq: Four times a day (QID) | ORAL | Status: DC | PRN
  Filled 2014-11-24: qty 10

## 2014-11-24 MED ORDER — SODIUM CHLORIDE 0.9 % IV SOLN
INTRAVENOUS | Status: DC
  Administered 2014-11-24: 18:00:00 via INTRAVENOUS

## 2014-11-24 MED ORDER — ONDANSETRON HCL 4 MG/2ML IJ SOLN
4.0000 mg | Freq: Four times a day (QID) | INTRAMUSCULAR | Status: DC | PRN
  Administered 2014-12-10: 4 mg via INTRAVENOUS
  Filled 2014-11-24: qty 2

## 2014-11-24 MED ORDER — CLINDAMYCIN HCL 300 MG PO CAPS
600.0000 mg | ORAL_CAPSULE | Freq: Three times a day (TID) | ORAL | Status: DC
  Administered 2014-11-24 – 2014-11-25 (×5): 600 mg via ORAL
  Filled 2014-11-24 (×8): qty 2

## 2014-11-24 MED ORDER — VITAMIN C 500 MG PO TABS
1000.0000 mg | ORAL_TABLET | Freq: Every day | ORAL | Status: DC
  Administered 2014-11-25 – 2014-12-29 (×29): 1000 mg via ORAL
  Filled 2014-11-24 (×34): qty 2

## 2014-11-24 MED ORDER — SODIUM CHLORIDE 0.9 % IV SOLN: 250.0000 mL | INTRAVENOUS | Status: DC | PRN

## 2014-11-24 MED ORDER — SENNA 8.6 MG PO TABS
1.0000 | ORAL_TABLET | Freq: Two times a day (BID) | ORAL | Status: DC
  Administered 2014-12-01 – 2014-12-28 (×38): 8.6 mg via ORAL
  Filled 2014-11-24 (×66): qty 1

## 2014-11-24 MED ORDER — LISINOPRIL 20 MG PO TABS
40.0000 mg | ORAL_TABLET | Freq: Every day | ORAL | Status: DC
  Administered 2014-11-29 – 2014-12-29 (×28): 40 mg via ORAL
  Filled 2014-11-24 (×3): qty 2
  Filled 2014-11-24: qty 1
  Filled 2014-11-24: qty 2
  Filled 2014-11-24 (×2): qty 1
  Filled 2014-11-24: qty 2
  Filled 2014-11-24 (×2): qty 1
  Filled 2014-11-24: qty 2
  Filled 2014-11-24 (×4): qty 1
  Filled 2014-11-24: qty 2
  Filled 2014-11-24 (×2): qty 1
  Filled 2014-11-24: qty 2
  Filled 2014-11-24: qty 1
  Filled 2014-11-24: qty 2
  Filled 2014-11-24 (×8): qty 1
  Filled 2014-11-24: qty 2
  Filled 2014-11-24 (×4): qty 1
  Filled 2014-11-24: qty 2

## 2014-11-24 MED ORDER — SODIUM CHLORIDE 0.9 % IJ SOLN: 3.0000 mL | INTRAMUSCULAR | Status: DC | PRN

## 2014-11-24 MED ORDER — ADULT MULTIVITAMIN LIQUID CH
10.0000 mL | Freq: Every day | ORAL | Status: DC
  Administered 2014-11-24 – 2014-11-25 (×2): 10 mL via ORAL
  Administered 2014-12-01: 5 mL via ORAL
  Administered 2014-12-02 – 2014-12-29 (×19): 10 mL via ORAL
  Filled 2014-11-24 (×38): qty 10

## 2014-11-24 MED ORDER — HYDROMORPHONE HCL 1 MG/ML IJ SOLN: 0.5000 mg | INTRAMUSCULAR | Status: DC | PRN

## 2014-11-24 MED ORDER — CHLORHEXIDINE GLUCONATE CLOTH 2 % EX PADS
6.0000 | MEDICATED_PAD | Freq: Every day | CUTANEOUS | Status: DC
  Administered 2014-11-25 – 2014-11-26 (×2): 6 via TOPICAL

## 2014-11-24 MED ORDER — AMLODIPINE BESYLATE 10 MG PO TABS
10.0000 mg | ORAL_TABLET | Freq: Every day | ORAL | Status: DC
  Administered 2014-11-25 – 2014-12-29 (×29): 10 mg via ORAL
  Filled 2014-11-24 (×5): qty 1
  Filled 2014-11-24: qty 2
  Filled 2014-11-24 (×28): qty 1

## 2014-11-24 MED ORDER — ONDANSETRON HCL 4 MG PO TABS: 4.0000 mg | ORAL_TABLET | Freq: Four times a day (QID) | ORAL | Status: DC | PRN

## 2014-11-24 MED ORDER — ENSURE COMPLETE PO LIQD
237.0000 mL | Freq: Two times a day (BID) | ORAL | Status: DC
  Administered 2014-11-30: 237 mL via ORAL

## 2014-11-24 MED ORDER — LEVETIRACETAM 100 MG/ML PO SOLN
500.0000 mg | Freq: Two times a day (BID) | ORAL | Status: DC
  Administered 2014-11-24 – 2014-11-25 (×3): 500 mg via ORAL
  Filled 2014-11-24 (×5): qty 5

## 2014-11-24 MED ORDER — DOCUSATE SODIUM 50 MG/5ML PO LIQD
100.0000 mg | Freq: Two times a day (BID) | ORAL | Status: DC
  Administered 2014-12-01 – 2014-12-09 (×11): 100 mg via ORAL
  Filled 2014-11-24 (×31): qty 10

## 2014-11-24 MED ORDER — SODIUM CHLORIDE 0.9 % IJ SOLN
3.0000 mL | Freq: Two times a day (BID) | INTRAMUSCULAR | Status: DC
  Administered 2014-11-26 – 2014-12-02 (×8): 3 mL via INTRAVENOUS
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
## 2014-11-24 NOTE — Progress Notes (Signed)
BLE dressings last changed 11/23/14 at night per RN from [place], Girtha Hake
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
## 2014-11-24 NOTE — Progress Notes (Signed)
Son at bedside, requested mitts. Bilateral mitts placed on pt d/t fidgeting, attempting to remove gown.
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
## 2014-11-25 ENCOUNTER — Inpatient Hospital Stay (HOSPITAL_COMMUNITY): Admission: RE | Admit: 2014-11-25 | Payer: Medicare Other | Source: Ambulatory Visit | Admitting: Neurosurgery

## 2014-11-25 ENCOUNTER — Encounter (HOSPITAL_COMMUNITY): Payer: Self-pay | Admitting: *Deleted

## 2014-11-25 LAB — POTASSIUM: POTASSIUM: 3.6 mmol/L (ref 3.5–5.1)

## 2014-11-25 LAB — POCT I-STAT, CHEM 8
BUN: 20 mg/dL (ref 6–23)
CALCIUM ION: 1.25 mmol/L (ref 1.13–1.30)
Chloride: 124 mEq/L — ABNORMAL HIGH (ref 96–112)
Creatinine, Ser: 0.8 mg/dL (ref 0.50–1.35)
GLUCOSE: 95 mg/dL (ref 70–99)
HCT: 33 % — ABNORMAL LOW (ref 39.0–52.0)
Hemoglobin: 11.2 g/dL — ABNORMAL LOW (ref 13.0–17.0)
POTASSIUM: 3.8 mmol/L (ref 3.5–5.1)
Sodium: 160 mmol/L — ABNORMAL HIGH (ref 135–145)
TCO2: 20 mmol/L (ref 0–100)

## 2014-11-25 LAB — ELECTROLYTE PANEL
Anion gap: 5 (ref 5–15)
CO2: 25 mmol/L (ref 19–32)
Chloride: 128 mEq/L — ABNORMAL HIGH (ref 96–112)
Potassium: 3.7 mmol/L (ref 3.5–5.1)
SODIUM: 158 mmol/L — AB (ref 135–145)

## 2014-11-25 LAB — SODIUM: Sodium: 153 mmol/L — ABNORMAL HIGH (ref 135–145)

## 2014-11-25 MED ORDER — LIDOCAINE HCL (CARDIAC) 20 MG/ML IV SOLN
INTRAVENOUS | Status: AC
  Filled 2014-11-25: qty 5

## 2014-11-25 MED ORDER — FENTANYL CITRATE 0.05 MG/ML IJ SOLN
INTRAMUSCULAR | Status: AC
  Filled 2014-11-25: qty 5

## 2014-11-25 MED ORDER — DEXTROSE 5 % IV SOLN
INTRAVENOUS | Status: DC
  Administered 2014-11-25: 16:00:00 via INTRAVENOUS

## 2014-11-25 MED ORDER — ROCURONIUM BROMIDE 50 MG/5ML IV SOLN
INTRAVENOUS | Status: AC
  Filled 2014-11-25: qty 1

## 2014-11-25 MED ORDER — MIDAZOLAM HCL 2 MG/2ML IJ SOLN
INTRAMUSCULAR | Status: AC
Start: 2014-11-25 — End: 2014-11-25
  Filled 2014-11-25: qty 2

## 2014-11-25 MED ORDER — PROPOFOL 10 MG/ML IV BOLUS
INTRAVENOUS | Status: AC
  Filled 2014-11-25: qty 20

## 2014-11-25 MED ORDER — KCL IN DEXTROSE-NACL 20-5-0.2 MEQ/L-%-% IV SOLN
INTRAVENOUS | Status: DC
  Administered 2014-11-25 – 2014-11-26 (×2): via INTRAVENOUS
  Filled 2014-11-25 (×3): qty 1000

## 2014-11-25 NOTE — Progress Notes (Signed)
Patient ID: William [name], male   DOB: 1934-06-06, 81 y.o.   MRN: [id number] BP 115/54 mmHg  Pulse 67  Temp(Src) 97 F (36.1 C) (Oral)  Resp 18  Ht 5\' 2"  (1.575 m)  Wt 58.8 kg (129 lb 10.1 oz)  BMI 23.70 kg/m2  SpO2 96% Alert, following some commands Still moving right more than left Is hypernatremic thus the OR was cancelled. IV fluids have been switched.  Will reschedule for tomorrow am Checking Na overnight.

## 2014-11-25 NOTE — Progress Notes (Signed)
UR completed.  Rodney Wigger, RN BSN M[facility] CCM Trauma/Neuro ICU Case Manager [phone]  
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
## 2014-11-25 NOTE — Anesthesia Preprocedure Evaluation (Addendum)
Anesthesia Evaluation  Patient identified by MRN, date of birth, ID band Patient awake    Reviewed: Allergy & Precautions, NPO status , Patient's Chart, lab work & pertinent test results, reviewed documented beta blocker date and time   Airway        Dental   Pulmonary former smoker (quit 1965),          Cardiovascular hypertension, Pt. on medications  EKG 08/2014 OK   Neuro/Psych Confusion and decreased mental status with current pathology    GI/Hepatic negative GI ROS, Neg liver ROS,   Endo/Other  diabetes, Type 2  Renal/GU      Musculoskeletal   Abdominal   Peds  Hematology   Anesthesia Other Findings Hypernatremia / Hypercloremia  Reproductive/Obstetrics                          Anesthesia Physical Anesthesia Plan  ASA: III  Anesthesia Plan: General   Post-op Pain Management:    Induction: Intravenous  Airway Management Planned: Oral ETT  Additional Equipment: Arterial line  Intra-op Plan:   Post-operative Plan: Possible Post-op intubation/ventilation  Informed Consent: I have reviewed the patients History and Physical, chart, labs and discussed the procedure including the risks, benefits and alternatives for the proposed anesthesia with the patient or authorized representative who has indicated his/her understanding and acceptance.     Plan Discussed with: CRNA, Anesthesiologist and Surgeon  Anesthesia Plan Comments: (Easy intubation miller 2 grade 1 view, 2nd IV please, need to recheck lytes this am sodium 160 and chloride 125 on yesterday lab error?  Had been normal 2 weeks ago.  Creat 1.0)      Anesthesia Quick Evaluation

## 2014-11-26 ENCOUNTER — Inpatient Hospital Stay (HOSPITAL_COMMUNITY): Payer: Medicare Other | Admitting: Anesthesiology

## 2014-11-26 ENCOUNTER — Encounter (HOSPITAL_COMMUNITY)
Admission: AD | Disposition: A | Discharge status: Skilled Nursing Facility | Payer: Self-pay | Source: Other Acute Inpatient Hospital | Attending: Neurosurgery

## 2014-11-26 HISTORY — PX: CRANIOTOMY: SHX93

## 2014-11-26 LAB — POTASSIUM: Potassium: 3.8 mmol/L (ref 3.5–5.1)

## 2014-11-26 LAB — SODIUM: SODIUM: 152 mmol/L — AB (ref 135–145)

## 2014-11-26 SURGERY — CRANIOTOMY HEMATOMA EVACUATION SUBDURAL
Anesthesia: General | Site: Head | Laterality: Right

## 2014-11-26 MED ORDER — PROPOFOL 10 MG/ML IV BOLUS
INTRAVENOUS | Status: AC
  Filled 2014-11-26: qty 20

## 2014-11-26 MED ORDER — CALCIUM CHLORIDE 10 % IV SOLN
INTRAVENOUS | Status: DC | PRN
  Administered 2014-11-26: 300 mg via INTRAVENOUS

## 2014-11-26 MED ORDER — CLINDAMYCIN PHOSPHATE 600 MG/50ML IV SOLN
600.0000 mg | Freq: Three times a day (TID) | INTRAVENOUS | Status: DC
  Administered 2014-11-26 – 2014-11-29 (×9): 600 mg via INTRAVENOUS
  Filled 2014-11-26 (×5): qty 50

## 2014-11-26 MED ORDER — MICROFIBRILLAR COLL HEMOSTAT EX PADS
MEDICATED_PAD | CUTANEOUS | Status: DC | PRN
  Administered 2014-11-26: 1 via TOPICAL

## 2014-11-26 MED ORDER — FENTANYL CITRATE 0.05 MG/ML IJ SOLN
INTRAMUSCULAR | Status: AC
  Filled 2014-11-26: qty 5

## 2014-11-26 MED ORDER — LIDOCAINE HCL (CARDIAC) 20 MG/ML IV SOLN
INTRAVENOUS | Status: AC
  Filled 2014-11-26: qty 5

## 2014-11-26 MED ORDER — ROCURONIUM BROMIDE 50 MG/5ML IV SOLN
INTRAVENOUS | Status: AC
  Filled 2014-11-26: qty 1

## 2014-11-26 MED ORDER — ARTIFICIAL TEARS OP OINT
TOPICAL_OINTMENT | OPHTHALMIC | Status: AC
  Filled 2014-11-26: qty 3.5

## 2014-11-26 MED ORDER — EPHEDRINE SULFATE 50 MG/ML IJ SOLN
INTRAMUSCULAR | Status: DC | PRN
  Administered 2014-11-26: 10 mg via INTRAVENOUS

## 2014-11-26 MED ORDER — SUCCINYLCHOLINE CHLORIDE 20 MG/ML IJ SOLN
INTRAMUSCULAR | Status: AC
  Filled 2014-11-26: qty 1

## 2014-11-26 MED ORDER — POLYETHYLENE GLYCOL 3350 17 G PO PACK
17.0000 g | PACK | Freq: Every day | ORAL | Status: DC | PRN
  Filled 2014-11-26: qty 1

## 2014-11-26 MED ORDER — LACTATED RINGERS IV SOLN
INTRAVENOUS | Status: DC | PRN
  Administered 2014-11-26: 10:00:00 via INTRAVENOUS

## 2014-11-26 MED ORDER — ROCURONIUM BROMIDE 100 MG/10ML IV SOLN
INTRAVENOUS | Status: DC | PRN
  Administered 2014-11-26: 20 mg via INTRAVENOUS
  Administered 2014-11-26 (×5): 10 mg via INTRAVENOUS
  Administered 2014-11-26: 20 mg via INTRAVENOUS
  Administered 2014-11-26: 10 mg via INTRAVENOUS

## 2014-11-26 MED ORDER — HYDROCODONE-ACETAMINOPHEN 5-325 MG PO TABS
1.0000 | ORAL_TABLET | ORAL | Status: DC | PRN
  Administered 2014-12-13 – 2014-12-21 (×2): 1 via ORAL
  Filled 2014-11-26 (×2): qty 1

## 2014-11-26 MED ORDER — MORPHINE SULFATE 2 MG/ML IJ SOLN
1.0000 mg | INTRAMUSCULAR | Status: DC | PRN
  Administered 2014-12-10: 2 mg via INTRAVENOUS
  Filled 2014-11-26: qty 1

## 2014-11-26 MED ORDER — THROMBIN 20000 UNITS EX SOLR
CUTANEOUS | Status: DC | PRN
  Administered 2014-11-26: 20 mL via TOPICAL

## 2014-11-26 MED ORDER — PHENYLEPHRINE HCL 10 MG/ML IJ SOLN
10.0000 mg | INTRAVENOUS | Status: DC | PRN
  Administered 2014-11-26: 40 ug/min via INTRAVENOUS

## 2014-11-26 MED ORDER — LIDOCAINE HCL 4 % MT SOLN
OROMUCOSAL | Status: DC | PRN
  Administered 2014-11-26: 4 mL via TOPICAL

## 2014-11-26 MED ORDER — LABETALOL HCL 5 MG/ML IV SOLN: 10.0000 mg | INTRAVENOUS | Status: DC | PRN

## 2014-11-26 MED ORDER — FENTANYL CITRATE 0.05 MG/ML IJ SOLN
INTRAMUSCULAR | Status: DC | PRN
  Administered 2014-11-26 (×2): 50 ug via INTRAVENOUS
  Administered 2014-11-26: 25 ug via INTRAVENOUS
  Administered 2014-11-26: 50 ug via INTRAVENOUS
  Administered 2014-11-26: 25 ug via INTRAVENOUS
  Administered 2014-11-26 (×2): 50 ug via INTRAVENOUS
  Administered 2014-11-26: 100 ug via INTRAVENOUS

## 2014-11-26 MED ORDER — LEVETIRACETAM IN NACL 500 MG/100ML IV SOLN
500.0000 mg | Freq: Two times a day (BID) | INTRAVENOUS | Status: DC
  Administered 2014-11-26 – 2014-11-29 (×7): 500 mg via INTRAVENOUS
  Filled 2014-11-26 (×9): qty 100

## 2014-11-26 MED ORDER — LEVETIRACETAM IN NACL 500 MG/100ML IV SOLN
500.0000 mg | Freq: Two times a day (BID) | INTRAVENOUS | Status: DC
  Administered 2014-11-26 (×3): 500 mg via INTRAVENOUS
  Filled 2014-11-26 (×2): qty 100

## 2014-11-26 MED ORDER — 0.9 % SODIUM CHLORIDE (POUR BTL) OPTIME
TOPICAL | Status: DC | PRN
  Administered 2014-11-26: 1000 mL

## 2014-11-26 MED ORDER — DEXTROSE 5 % IV SOLN
INTRAVENOUS | Status: DC | PRN
  Administered 2014-11-26: 10:00:00 via INTRAVENOUS

## 2014-11-26 MED ORDER — POTASSIUM CHLORIDE IN NACL 20-0.9 MEQ/L-% IV SOLN
INTRAVENOUS | Status: DC
  Administered 2014-11-26 – 2014-12-01 (×7): via INTRAVENOUS
  Administered 2014-12-01: 1000 mL via INTRAVENOUS
  Administered 2014-12-02: 12:00:00 via INTRAVENOUS
  Administered 2014-12-03: 1000 mL via INTRAVENOUS
  Administered 2014-12-04: 22:00:00 via INTRAVENOUS
  Filled 2014-11-26 (×15): qty 1000

## 2014-11-26 MED ORDER — ARTIFICIAL TEARS OP OINT
TOPICAL_OINTMENT | OPHTHALMIC | Status: DC | PRN
  Administered 2014-11-26: 1 via OPHTHALMIC

## 2014-11-26 MED ORDER — FLEET ENEMA 7-19 GM/118ML RE ENEM: 1.0000 | ENEMA | Freq: Once | RECTAL | Status: AC | PRN

## 2014-11-26 MED ORDER — EPHEDRINE SULFATE 50 MG/ML IJ SOLN
INTRAMUSCULAR | Status: AC
  Filled 2014-11-26: qty 1

## 2014-11-26 MED ORDER — SODIUM CHLORIDE 0.9 % IJ SOLN
INTRAMUSCULAR | Status: AC
  Filled 2014-11-26: qty 10

## 2014-11-26 MED ORDER — ALBUMIN HUMAN 5 % IV SOLN
INTRAVENOUS | Status: DC | PRN
  Administered 2014-11-26: 12:00:00 via INTRAVENOUS

## 2014-11-26 MED ORDER — PROPOFOL 10 MG/ML IV BOLUS
INTRAVENOUS | Status: DC | PRN
  Administered 2014-11-26: 100 mg via INTRAVENOUS

## 2014-11-26 MED ORDER — BISACODYL 5 MG PO TBEC: 5.0000 mg | DELAYED_RELEASE_TABLET | Freq: Every day | ORAL | Status: DC | PRN

## 2014-11-26 MED ORDER — NALOXONE HCL 0.4 MG/ML IJ SOLN: 0.0800 mg | INTRAMUSCULAR | Status: DC | PRN

## 2014-11-26 MED ORDER — CLINDAMYCIN PHOSPHATE 600 MG/50ML IV SOLN
600.0000 mg | Freq: Three times a day (TID) | INTRAVENOUS | Status: DC
  Administered 2014-11-26 – 2014-11-27 (×2): 600 mg via INTRAVENOUS
  Filled 2014-11-26 (×8): qty 50

## 2014-11-26 SURGICAL SUPPLY — 71 items
BANDAGE GAUZE 4  KLING STR (GAUZE/BANDAGES/DRESSINGS) ×3 IMPLANT
BENZOIN TINCTURE PRP APPL 2/3 (GAUZE/BANDAGES/DRESSINGS) IMPLANT
BLADE CLIPPER SURG (BLADE) ×3 IMPLANT
BLADE ULTRA TIP 2M (BLADE) ×3 IMPLANT
BNDG GAUZE ELAST 4 BULKY (GAUZE/BANDAGES/DRESSINGS) ×6 IMPLANT
BRUSH SCRUB EZ 1% IODOPHOR (MISCELLANEOUS) ×3 IMPLANT
BUR ACORN 6.0 PRECISION (BURR) ×2 IMPLANT
BUR ACORN 6.0MM PRECISION (BURR) ×1
BUR ADDG 1.1 (BURR) IMPLANT
BUR ADDG 1.1MM (BURR)
BUR MATCHSTICK NEURO 3.0 LAGG (BURR) IMPLANT
BUR ROUTER D-58 CRANI (BURR) IMPLANT
CANISTER SUCT 3000ML (MISCELLANEOUS) ×3 IMPLANT
CLIP TI MEDIUM 6 (CLIP) IMPLANT
CONT SPEC 4OZ CLIKSEAL STRL BL (MISCELLANEOUS) ×9 IMPLANT
DRAIN SNY WOU 7FLT (WOUND CARE) IMPLANT
DRAPE NEUROLOGICAL W/INCISE (DRAPES) ×6 IMPLANT
DRAPE SURG 17X23 STRL (DRAPES) IMPLANT
DRAPE WARM FLUID 44X44 (DRAPE) ×3 IMPLANT
DRSG TELFA 3X8 NADH (GAUZE/BANDAGES/DRESSINGS) ×3 IMPLANT
DURAMATRIX ONLAY 2X2 (Neuro Prosthesis/Implant) ×3 IMPLANT
DURAPREP 6ML APPLICATOR 50/CS (WOUND CARE) ×3 IMPLANT
ELECT CAUTERY BLADE 6.4 (BLADE) ×3 IMPLANT
ELECT REM PT RETURN 9FT ADLT (ELECTROSURGICAL) ×3
ELECTRODE REM PT RTRN 9FT ADLT (ELECTROSURGICAL) ×1 IMPLANT
EVACUATOR 1/8 PVC DRAIN (DRAIN) IMPLANT
EVACUATOR SILICONE 100CC (DRAIN) IMPLANT
GAUZE SPONGE 4X4 12PLY STRL (GAUZE/BANDAGES/DRESSINGS) ×3 IMPLANT
GAUZE SPONGE 4X4 16PLY XRAY LF (GAUZE/BANDAGES/DRESSINGS) IMPLANT
GLOVE BIO SURGEON STRL SZ 6.5 (GLOVE) ×4 IMPLANT
GLOVE BIO SURGEON STRL SZ7 (GLOVE) ×3 IMPLANT
GLOVE BIO SURGEONS STRL SZ 6.5 (GLOVE) ×2
GLOVE ECLIPSE 6.5 STRL STRAW (GLOVE) ×3 IMPLANT
GLOVE EXAM NITRILE LRG STRL (GLOVE) IMPLANT
GLOVE EXAM NITRILE MD LF STRL (GLOVE) IMPLANT
GLOVE EXAM NITRILE XL STR (GLOVE) IMPLANT
GLOVE EXAM NITRILE XS STR PU (GLOVE) IMPLANT
GOWN STRL REUS W/ TWL LRG LVL3 (GOWN DISPOSABLE) ×2 IMPLANT
GOWN STRL REUS W/ TWL XL LVL3 (GOWN DISPOSABLE) IMPLANT
GOWN STRL REUS W/TWL 2XL LVL3 (GOWN DISPOSABLE) IMPLANT
GOWN STRL REUS W/TWL LRG LVL3 (GOWN DISPOSABLE) ×4
GOWN STRL REUS W/TWL XL LVL3 (GOWN DISPOSABLE)
HEMOSTAT SURGICEL 2X14 (HEMOSTASIS) IMPLANT
KIT BASIN OR (CUSTOM PROCEDURE TRAY) ×3 IMPLANT
KIT ROOM TURNOVER OR (KITS) ×3 IMPLANT
NEEDLE HYPO 25X1 1.5 SAFETY (NEEDLE) ×3 IMPLANT
NS IRRIG 1000ML POUR BTL (IV SOLUTION) ×3 IMPLANT
PACK CRANIOTOMY (CUSTOM PROCEDURE TRAY) ×3 IMPLANT
PATTIES SURGICAL .5 X.5 (GAUZE/BANDAGES/DRESSINGS) IMPLANT
PATTIES SURGICAL .5 X3 (DISPOSABLE) IMPLANT
PATTIES SURGICAL 1X1 (DISPOSABLE) IMPLANT
RUBBERBAND STERILE (MISCELLANEOUS) ×6 IMPLANT
SCREW SELF DRILL HT 1.5/4MM (Screw) ×6 IMPLANT
SPONGE NEURO XRAY DETECT 1X3 (DISPOSABLE) IMPLANT
SPONGE SURGIFOAM ABS GEL 100 (HEMOSTASIS) ×3 IMPLANT
STAPLER VISISTAT 35W (STAPLE) ×3 IMPLANT
SUT ETHILON 3 0 FSL (SUTURE) IMPLANT
SUT ETHILON 3 0 PS 1 (SUTURE) IMPLANT
SUT NURALON 4 0 TR CR/8 (SUTURE) ×9 IMPLANT
SUT PL GUT 3 0 FS 1 (SUTURE) IMPLANT
SUT STEEL 0 (SUTURE)
SUT STEEL 0 18XMFL TIE 17 (SUTURE) IMPLANT
SUT VIC AB 2-0 CT2 18 VCP726D (SUTURE) ×6 IMPLANT
SYR CONTROL 10ML LL (SYRINGE) ×3 IMPLANT
TOWEL OR 17X24 6PK STRL BLUE (TOWEL DISPOSABLE) ×3 IMPLANT
TOWEL OR 17X26 10 PK STRL BLUE (TOWEL DISPOSABLE) ×3 IMPLANT
TRAY FOLEY CATH 14FRSI W/METER (CATHETERS) ×3 IMPLANT
TUBE CONNECTING 12'X1/4 (SUCTIONS) ×1
TUBE CONNECTING 12X1/4 (SUCTIONS) ×2 IMPLANT
UNDERPAD 30X30 INCONTINENT (UNDERPADS AND DIAPERS) ×3 IMPLANT
WATER STERILE IRR 1000ML POUR (IV SOLUTION) ×3 IMPLANT

## 2014-11-26 NOTE — Transfer of Care (Signed)
Immediate Anesthesia Transfer of Care Note  Patient: William [name]  Procedure(s) Performed: Procedure(s) with comments: craniotomy for evacuation of hygroma, removal of reurrent middle fossa meninioma (Right) - craniotomy for evacuation of hygroma, removal of reurrent middle fossa meninioma  Patient Location: NICU  Anesthesia Type:General  Level of Consciousness: sedated  Airway & Oxygen Therapy: Patient remains intubated per anesthesia plan and Patient placed on Ventilator (see vital sign flow sheet for setting)  Post-op Assessment: Report given to PACU RN and Post -op Vital signs reviewed and stable  Post vital signs: Reviewed and stable  Complications: No apparent anesthesia complications

## 2014-11-26 NOTE — Transfer of Care (Signed)
Immediate Anesthesia Transfer of Care Note  Patient: William [name]  Procedure(s) Performed: Procedure(s) with comments: craniotomy for evacuation of hygroma, removal of reurrent middle fossa meninioma (Right) - craniotomy for evacuation of hygroma, removal of reurrent middle fossa meninioma  Patient Location: NICU  Anesthesia Type:General  Level of Consciousness: sedated and Patient remains intubated per anesthesia plan  Airway & Oxygen Therapy: Patient remains intubated per anesthesia plan and Patient placed on Ventilator (see vital sign flow sheet for setting)  Post-op Assessment: Post -op Vital signs reviewed and stable  Post vital signs: stable  Complications: No apparent anesthesia complications

## 2014-11-26 NOTE — Op Note (Signed)
BP 115/56 mmHg  Pulse 67  Temp(Src) 98.1 F (36.7 C) (Oral)  Resp 10  Ht 5\' 2"  (1.575 m)  Wt 58.8 kg (129 lb 10.1 oz)  BMI 23.70 kg/m2  SpO2 100% 11/24/2014 - 11/26/2014  2:24 PM  PATIENT:  William [name]  81 y.o. male  PRE-OPERATIVE DIAGNOSIS:  hygroma  POST-OPERATIVE DIAGNOSIS:right middle fossa/tentorial   recurrent  meningioma, hygroma  PROCEDURE:  Procedure(s): craniotomy for evacuation of hygroma, reurrent middle fossa meningioma resection  Microdissection   SURGEON: Surgeon(s): Ashok Pall, MD  ASSISTANTS:none  ANESTHESIA:   general  EBL:  Total I/O In: 1200 [I.V.:700; IV Piggyback:500] Out: 530 [Urine:430; Blood:100]  BLOOD ADMINISTERED:none  CELL SAVER GIVEN:none  COUNT:per nursing  DRAINS: none   SPECIMEN:  Source of Specimen:  right middle fossa/posterior fossa  DICTATION: William [name] was taken to the operating room, intubated, and placed under a general anesthetic  without difficulty.He was positioned supine with his head turned towards the left supported by a gel donut His head and previous craniotomy incision was prepped and draped in a sterile manner.  I opened the skin with a 10 blade and reflected the flap anteriorly exposing the craniotomy flap. I secured the flap with towel clips and rubberbands to retract the flap. I remove the skull side screws on the plates, then removed the craniotomy flap. I opened the dura with the 15 blade and Metzenbaum scissors in a semicircle fashion again reflecting the flap rostrally.I removed a clear filmy substance which I believe was Gelfilm which I placed on the brain during the last craniotomy in October of 2015. A great deal of clear fluid consistent with csf was removed exterior to the brain.  Upon inspection of the middle fossa I observed at the front what appeared to be meningioma. I scraped that from the skull and sent it to pathology. This was ~13mm in diameter and attached to the dura. I then inspected the tentorium at  its junction with the clinoid. I brought the microscope into the field and dissected the temporal lobe and the frontal lobe away from the tentorium. I found a mass Consistent with soft meningioma ,encapsulated, extending into the posterior fossa. It was a greyish white capsule which i encountered. I spent time dissecting it from the tentorium, and the brainstem, then removing it in piecemeal fashion. I believe I achieved a gross total resection, but I could not be sure. I then irrigated and closed. I did separate the specimen from the anterior middle fossa specimen.  I approximated the dura, then the skull with plates and screws, and finally the scalp with vicryl sutures.   PLAN OF CARE: Admit to inpatient   PATIENT DISPOSITION:  PACU - hemodynamically stable.   Delay start of Pharmacological VTE agent (>24hrs) due to surgical blood loss or risk of bleeding:  yes

## 2014-11-26 NOTE — Procedures (Signed)
Extubation Procedure Note  Patient Details:   Name: William [name] DOB: 1934/03/17 MRN: [id number]   Airway Documentation:     Evaluation  O2 sats: stable throughout Complications: No apparent complications Patient did tolerate procedure well. Bilateral Breath Sounds: Clear Suctioning: Airway Yes, Pt. extubated without incident after cpap/ps wean X 20 min on 03/23/39% with good numbers, had (+) cuff leak with initial cuff pressure check, NIF-(-40)cmh20, FVC not able to perform due to neuro status, placed on 2 lpm n/c s/p extubation, tolerating well, RT to monitor.  Winferd Humphrey 11/26/2014, 2:55 PM

## 2014-11-26 NOTE — Anesthesia Procedure Notes (Signed)
Procedure Name: Intubation Date/Time: 11/26/2014 10:05 AM Performed by: Izora Gala Pre-anesthesia Checklist: Patient identified, Emergency Drugs available, Suction available and Patient being monitored Patient Re-evaluated:Patient Re-evaluated prior to inductionOxygen Delivery Method: Circle system utilized Preoxygenation: Pre-oxygenation with 100% oxygen Intubation Type: IV induction Ventilation: Mask ventilation without difficulty Laryngoscope Size: Miller and 3 Grade View: Grade I Tube type: Oral Tube size: 7.5 mm Number of attempts: 1 Airway Equipment and Method: Stylet and LTA kit utilized Placement Confirmation: ETT inserted through vocal cords under direct vision,  positive ETCO2 and breath sounds checked- equal and bilateral Secured at: 22 cm Tube secured with: Tape Dental Injury: Teeth and Oropharynx as per pre-operative assessment
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
## 2014-11-26 NOTE — Anesthesia Postprocedure Evaluation (Signed)
  Anesthesia Post-op Note  Patient: William [name]  Procedure(s) Performed: Procedure(s) with comments: craniotomy for evacuation of hygroma, removal of reurrent middle fossa meninioma (Right) - craniotomy for evacuation of hygroma, removal of reurrent middle fossa meninioma  Patient Location: NICU  Anesthesia Type:General  Level of Consciousness: sedated and Patient remains intubated per anesthesia plan  Airway and Oxygen Therapy: Patient Spontanous Breathing and Patient placed on Ventilator (see vital sign flow sheet for setting)  Post-op Pain: none  Post-op Assessment: Post-op Vital signs reviewed, Patient's Cardiovascular Status Stable, Respiratory Function Stable, Patent Airway, No signs of Nausea or vomiting and Pain level controlled  Post-op Vital Signs: stable  Last Vitals:  Filed Vitals:   11/26/14 0900  BP: 130/71  Pulse: 70  Temp:   Resp: 20    Complications: No apparent anesthesia complications

## 2014-11-26 NOTE — Addendum Note (Signed)
Addendum  created 11/26/14 1422 by Izora Gala, CRNA   Modules edited: Anesthesia Events, Narrator, Notes Section   Narrator:  Narrator: Event Log Edited   Notes Section:  Pend: [id number]

## 2014-11-26 NOTE — Progress Notes (Signed)
Arterial line Blood Gas results on pt.@1424  hrs. were,(PH.-7.462/PC02-29.9/P02-103/HC03-21.3/TC02-22/Sat-98%, results were scanned to wrong E chart, Point of Care, lab result edit sheet to be placed, RT.

## 2014-11-27 ENCOUNTER — Inpatient Hospital Stay (HOSPITAL_COMMUNITY): Payer: Medicare Other

## 2014-11-27 ENCOUNTER — Encounter (HOSPITAL_COMMUNITY): Payer: Self-pay

## 2014-11-27 LAB — CBC WITH DIFFERENTIAL/PLATELET
BASOS ABS: 0 10*3/uL (ref 0.0–0.1)
Basophils Relative: 0 % (ref 0–1)
Eosinophils Absolute: 0 10*3/uL (ref 0.0–0.7)
Eosinophils Relative: 0 % (ref 0–5)
HCT: 31.2 % — ABNORMAL LOW (ref 39.0–52.0)
HEMOGLOBIN: 10.2 g/dL — AB (ref 13.0–17.0)
LYMPHS ABS: 0.9 10*3/uL (ref 0.7–4.0)
Lymphocytes Relative: 5 % — ABNORMAL LOW (ref 12–46)
MCH: 29.6 pg (ref 26.0–34.0)
MCHC: 32.7 g/dL (ref 30.0–36.0)
MCV: 90.4 fL (ref 78.0–100.0)
Monocytes Absolute: 0.8 10*3/uL (ref 0.1–1.0)
Monocytes Relative: 4 % (ref 3–12)
NEUTROS PCT: 91 % — AB (ref 43–77)
Neutro Abs: 17.6 10*3/uL — ABNORMAL HIGH (ref 1.7–7.7)
Platelets: 441 10*3/uL — ABNORMAL HIGH (ref 150–400)
RBC: 3.45 MIL/uL — AB (ref 4.22–5.81)
RDW: 13.5 % (ref 11.5–15.5)
WBC: 19.4 10*3/uL — ABNORMAL HIGH (ref 4.0–10.5)

## 2014-11-27 LAB — URINE MICROSCOPIC-ADD ON

## 2014-11-27 LAB — URINALYSIS, ROUTINE W REFLEX MICROSCOPIC
Bilirubin Urine: NEGATIVE
Glucose, UA: NEGATIVE mg/dL
KETONES UR: 15 mg/dL — AB
Nitrite: NEGATIVE
PH: 6.5 (ref 5.0–8.0)
Protein, ur: NEGATIVE mg/dL
Specific Gravity, Urine: 1.014 (ref 1.005–1.030)
UROBILINOGEN UA: 1 mg/dL (ref 0.0–1.0)

## 2014-11-27 LAB — SODIUM: Sodium: 148 mmol/L — ABNORMAL HIGH (ref 135–145)

## 2014-11-27 NOTE — Progress Notes (Signed)
Patient ID: William [name], male   DOB: October 04, 1934, 81 y.o.   MRN: [id number] BP 144/62 mmHg  Pulse 87  Temp(Src) 100.3 F (37.9 C) (Axillary)  Resp 22  Ht 5\' 2"  (1.575 m)  Wt 58.8 kg (129 lb 10.1 oz)  BMI 23.70 kg/m2  SpO2 100% Lethargic, perrl,  Moving right side briskly, slower on the left Symmetric facies Does open right eye to voice Will order ct scan to evaluate resection cavity Will also order a urinanalysis has low grade fever
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
## 2014-11-27 NOTE — Progress Notes (Signed)
Pt had not voided since removal of foley. Bladder scan- 770. I&O- 830. Will cont to monitor for need of foley replacement per protocol.

## 2014-11-27 NOTE — Progress Notes (Signed)
Patient ID: William [name], male   DOB: Apr 14, 1934, 81 y.o.   MRN: [id number] Subjective:  The patient is somnolent but arousable. There is a language barrier.  Objective: Vital signs in last 24 hours: Temp:  [98.4 F (36.9 C)-100.3 F (37.9 C)] 100.3 F (37.9 C) (01/10 0800) Pulse Rate:  [67-102] 87 (01/10 0800) Resp:  [10-26] 22 (01/10 0800) BP: (108-159)/(56-93) 144/62 mmHg (01/10 0800) SpO2:  [94 %-100 %] 100 % (01/10 0800) Arterial Line BP: (148-174)/(56-90) 174/69 mmHg (01/09 1700) FiO2 (%):  [30 %-40 %] 30 % (01/09 1344)  Intake/Output from previous day: 01/09 0701 - 01/10 0700 In: 5548.9 [I.V.:2048.9; IV Piggyback:1150] Out: 2230 [Urine:2130; Blood:100] Intake/Output this shift:    Physical exam the patient will open his eyes to voice. His eyes are a bit swollen. His pupils are equal. He localizes briskly on the right. He is left hemiparetic.  Lab Results:  Recent Labs  11/24/14 1710 11/25/14 1542  WBC 14.8*  --   HGB 11.2* 11.2*  HCT 35.7* 33.0*  PLT 524*  --    BMET  Recent Labs  11/24/14 1710 11/25/14 1206 11/25/14 1542 11/25/14 2020 11/26/14 0220 11/27/14 0240  NA 160* 158* 160* 153* 152* 148*  K 3.7 3.7 3.8 3.6 3.8  --   CL 125* 128* 124*  --   --   --   CO2 26 25  --   --   --   --   GLUCOSE 134*  --  95  --   --   --   BUN 27*  --  20  --   --   --   CREATININE 1.00  --  0.80  --   --   --   CALCIUM 9.0  --   --   --   --   --     Studies/Results: No results found.  Assessment/Plan: Postop day #1: The patient is slowly recovering from surgery. His neurologic exam is stable. His sodium is 148.  LOS: 3 days     Niani Mourer D 11/27/2014, 8:33 AM

## 2014-11-27 NOTE — Progress Notes (Addendum)
Clinical Social Work Department CLINICAL SOCIAL WORK PLACEMENT NOTE 11/27/2014  Patient:  William [name], William [name]  Account Number:  [id number] Admit date:  11/24/2014  Clinical Social Worker:  Welford Roche, [place]  Date/time:  11/26/2014 01:00 PM  Clinical Social Work is seeking post-discharge placement for this patient at the following level of care:   SKILLED NURSING   (*CSW will update this form in Epic as items are completed)   11/26/2014  Patient/family provided with [place] Department of Clinical Social Work's list of facilities offering this level of care within the geographic area requested by the patient (or if unable, by the patient's family).  11/26/2014  Patient/family informed of their freedom to choose among providers that offer the needed level of care, that participate in Medicare, Medicaid or managed care program needed by the patient, have an available bed and are willing to accept the patient.  11/26/2014  Patient/family informed of [facility]' ownership interest in [facility], as well as of the fact that they are under no obligation to receive care at this facility.  PASARR submitted to EDS on 11/25/2014 PASARR number received on 11/25/2014  FL2 transmitted to all facilities in geographic area requested by pt/family on  11/26/2014 FL2 transmitted to all facilities within larger geographic area on 11/26/2014  Patient informed that his/her managed care company has contracts with or will negotiate with  certain facilities, including the following:     Patient/family informed of bed offers received:  11/28/2014 [facility] at  [facility] (Lubertha Sayres, [facility]) Physician recommends and patient chooses bed at    Patient to be transferred to  [facility] on  12/29/2014 Lubertha Sayres, [place]) Patient to be transferred to facility by [facility] Lubertha Sayres, Latanya Presser) Patient and family notified of transfer on 12/29/2014 Lubertha Sayres, Latanya Presser) Name of family member  notified:  Patient's son, William [name], updated regarding discharge. [place])  The following physician request were entered in Epic:   Additional Comments:  [facility] [place] [phone]) Licensed Clinical Social Worker Orthopedics [phone]) and Surgical [phone])

## 2014-11-28 ENCOUNTER — Encounter (HOSPITAL_COMMUNITY): Payer: Self-pay | Admitting: Neurosurgery

## 2014-11-28 LAB — POCT I-STAT 3, ART BLOOD GAS (G3+)
Acid-base deficit: 3 mmol/L — ABNORMAL HIGH (ref 0.0–2.0)
BICARBONATE: 20.6 meq/L (ref 20.0–24.0)
O2 Saturation: 99 %
PATIENT TEMPERATURE: 98.6
TCO2: 21 mmol/L (ref 0–100)
pCO2 arterial: 28.2 mmHg — ABNORMAL LOW (ref 35.0–45.0)
pH, Arterial: 7.471 — ABNORMAL HIGH (ref 7.350–7.450)
pO2, Arterial: 123 mmHg — ABNORMAL HIGH (ref 80.0–100.0)

## 2014-11-28 MED ORDER — CHLORHEXIDINE GLUCONATE 0.12 % MT SOLN
15.0000 mL | Freq: Two times a day (BID) | OROMUCOSAL | Status: DC
  Administered 2014-11-28 – 2014-12-20 (×40): 15 mL via OROMUCOSAL
  Filled 2014-11-28 (×36): qty 15

## 2014-11-28 MED ORDER — CETYLPYRIDINIUM CHLORIDE 0.05 % MT LIQD
7.0000 mL | Freq: Two times a day (BID) | OROMUCOSAL | Status: DC
  Administered 2014-11-28 – 2014-12-19 (×39): 7 mL via OROMUCOSAL

## 2014-11-28 NOTE — Clinical Social Work Note (Signed)
Clinical Social Worker spoke with patient son over the phone to offer continued support and discuss bed offers.  Patient son states that patient family lives in the [place] area and would prefer to stay in that area.  Patient son states that first choice would be [facility] and second choice would be [facility].  CSW has left a message with [facility] to inquire about possible bed offer.  CSW remains available for support and to facilitate patient discharge needs once medically ready.  Barbette Or, [place]

## 2014-11-28 NOTE — Progress Notes (Signed)
Patient ID: William [name], male   DOB: 02-08-1934, 81 y.o.   MRN: [id number] BP 148/70 mmHg  Pulse 93  Temp(Src) 98.6 F (37 C) (Oral)  Resp 18  Ht 5\' 2"  (1.575 m)  Wt 58.8 kg (129 lb 10.1 oz)  BMI 23.70 kg/m2  SpO2 94% Alert, following some commands Moving right side more than left  Ct showed right to left shift, significant amount of intracranial air. Not sure that drainage or burr hole would help, he has improved over the course of the day. Will give more time.  Wound dressing is dry. \ Perrl, full eom, symmetric facies

## 2014-11-28 NOTE — Clinical Documentation Improvement (Signed)
Presents with meningioma; resection/microdissection performed.   Was seen by Nutrition on 1/8 who revealed the patient had "Severe malnutrition in the context of acute illness or injury as evidenced by severe fat and muscle depletion and 10% weight loss x 3 weeks". BMI of 23.7 documented.  No acknowledgment of severe malnutrition seen in progress notes or on problem list   Please clarify if you feel Severe Malnutrition has: Ruled in           Ruled out   Please document findings in next progress note, problem list and include in discharge summary if applicable.  Thank You, Zoila Shutter ,RN Clinical Documentation Specialist:  [place] Information Management

## 2014-11-29 MED ORDER — CLINDAMYCIN PHOSPHATE 600 MG/50ML IV SOLN
600.0000 mg | Freq: Three times a day (TID) | INTRAVENOUS | Status: DC
  Administered 2014-11-29 – 2014-11-30 (×2): 600 mg via INTRAVENOUS
  Filled 2014-11-29 (×5): qty 50

## 2014-11-29 NOTE — Progress Notes (Signed)
Patient ID: William [name], male   DOB: Apr 11, 1934, 81 y.o.   MRN: [id number] BP 128/63 mmHg  Pulse 83  Temp(Src) 99.7 F (37.6 C) (Axillary)  Resp 17  Ht 5\' 2"  (1.575 m)  Wt 58.8 kg (129 lb 10.1 oz)  BMI 23.70 kg/m2  SpO2 91% Alert, does follow commands Moving right greater than left Wound dressing is dry Slightly improved today

## 2014-11-30 LAB — URINALYSIS, ROUTINE W REFLEX MICROSCOPIC
Glucose, UA: NEGATIVE mg/dL
Ketones, ur: 15 mg/dL — AB
Nitrite: NEGATIVE
PROTEIN: 100 mg/dL — AB
Specific Gravity, Urine: 1.024 (ref 1.005–1.030)
Urobilinogen, UA: 1 mg/dL (ref 0.0–1.0)
pH: 5 (ref 5.0–8.0)

## 2014-11-30 LAB — CBC WITH DIFFERENTIAL/PLATELET
Basophils Absolute: 0 10*3/uL (ref 0.0–0.1)
Basophils Relative: 0 % (ref 0–1)
EOS ABS: 0 10*3/uL (ref 0.0–0.7)
EOS PCT: 0 % (ref 0–5)
HEMATOCRIT: 30.3 % — AB (ref 39.0–52.0)
Hemoglobin: 10.1 g/dL — ABNORMAL LOW (ref 13.0–17.0)
LYMPHS PCT: 9 % — AB (ref 12–46)
Lymphs Abs: 1 10*3/uL (ref 0.7–4.0)
MCH: 29.9 pg (ref 26.0–34.0)
MCHC: 33.3 g/dL (ref 30.0–36.0)
MCV: 89.6 fL (ref 78.0–100.0)
MONO ABS: 0.8 10*3/uL (ref 0.1–1.0)
Monocytes Relative: 7 % (ref 3–12)
Neutro Abs: 9 10*3/uL — ABNORMAL HIGH (ref 1.7–7.7)
Neutrophils Relative %: 84 % — ABNORMAL HIGH (ref 43–77)
PLATELETS: 458 10*3/uL — AB (ref 150–400)
RBC: 3.38 MIL/uL — ABNORMAL LOW (ref 4.22–5.81)
RDW: 13.7 % (ref 11.5–15.5)
WBC: 10.8 10*3/uL — ABNORMAL HIGH (ref 4.0–10.5)

## 2014-11-30 LAB — BASIC METABOLIC PANEL
Anion gap: 8 (ref 5–15)
BUN: 15 mg/dL (ref 6–23)
CO2: 21 mmol/L (ref 19–32)
CREATININE: 0.81 mg/dL (ref 0.50–1.35)
Calcium: 8.6 mg/dL (ref 8.4–10.5)
Chloride: 116 mEq/L — ABNORMAL HIGH (ref 96–112)
GFR calc non Af Amer: 83 mL/min — ABNORMAL LOW (ref >90)
Glucose, Bld: 146 mg/dL — ABNORMAL HIGH (ref 70–99)
Potassium: 3.3 mmol/L — ABNORMAL LOW (ref 3.5–5.1)
Sodium: 145 mmol/L (ref 135–145)

## 2014-11-30 LAB — URINE MICROSCOPIC-ADD ON

## 2014-11-30 MED ORDER — LEVETIRACETAM 500 MG PO TABS
500.0000 mg | ORAL_TABLET | Freq: Two times a day (BID) | ORAL | Status: DC
  Administered 2014-11-30 – 2014-12-06 (×13): 500 mg via ORAL
  Filled 2014-11-30 (×16): qty 1

## 2014-11-30 MED ORDER — BOOST / RESOURCE BREEZE PO LIQD
1.0000 | Freq: Three times a day (TID) | ORAL | Status: DC
  Administered 2014-11-30 – 2014-12-13 (×27): 1 via ORAL
  Administered 2014-12-13 (×2): via ORAL
  Administered 2014-12-14 – 2014-12-27 (×32): 1 via ORAL

## 2014-11-30 NOTE — Progress Notes (Signed)
Patient ID: William [name], male   DOB: 06/04/1934, 81 y.o.   MRN: [id number] BP 155/68 mmHg  Pulse 84  Temp(Src) 97.8 F (36.6 C) (Oral)  Resp 19  Ht 5\' 2"  (1.575 m)  Wt 58.8 kg (129 lb 10.1 oz)  BMI 23.70 kg/m2  SpO2 96% Lethargic, arouses to voice Following commands Speaking, non fluent, dysarthric Dressing is clean, and dry Will order picc line
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
## 2014-12-01 MED ORDER — PNEUMOCOCCAL VAC POLYVALENT 25 MCG/0.5ML IJ INJ: 0.5000 mL | INJECTION | INTRAMUSCULAR | Status: DC

## 2014-12-01 NOTE — Clinical Social Work Note (Signed)
Clinical Social Worker continuing to follow patient and family for support and discharge planning needs.  Patient was accepted on Monday [facility]/11 [facility], however due to patient extended hospital stay bed availability at the facility may be in question.  CSW to follow up with patient family and facility to determine bed availability.  CSW remains available for support and to facilitate patient discharge needs once medically stable.  Barbette Or, [place]

## 2014-12-01 NOTE — Progress Notes (Signed)
Patient ID: William [name], male   DOB: [birth year], 82 y.o.   MRN: [id number] BP 133/57 mmHg  Pulse 93  Temp(Src) 99 F (37.2 C) (Oral)  Resp 18  Ht 5\' 2"  (1.575 m)  Wt 58.8 kg (129 lb 10.1 oz)  BMI 23.70 kg/m2  SpO2 100% Lethargic arouses to voice Urine culture pending Mild fever today Moving right side greater than left Will follow some commands, confused at times Will transfer to floor

## 2014-12-02 LAB — URINE CULTURE: Colony Count: >=100000

## 2014-12-02 LAB — POCT I-STAT 4, (NA,K, GLUC, HGB,HCT)
GLUCOSE: 153 mg/dL — AB (ref 70–99)
HCT: 28 % — ABNORMAL LOW (ref 39.0–52.0)
Hemoglobin: 9.5 g/dL — ABNORMAL LOW (ref 13.0–17.0)
Potassium: 3.4 mmol/L — ABNORMAL LOW (ref 3.5–5.1)
Sodium: 149 mmol/L — ABNORMAL HIGH (ref 135–145)

## 2014-12-02 MED ORDER — VANCOMYCIN HCL 500 MG IV SOLR
500.0000 mg | Freq: Two times a day (BID) | INTRAVENOUS | Status: DC
  Administered 2014-12-02 – 2014-12-05 (×6): 500 mg via INTRAVENOUS
  Filled 2014-12-02 (×9): qty 500

## 2014-12-02 NOTE — Progress Notes (Signed)
Patient ID: William [name], male   DOB: 1934/03/01, 81 y.o.   MRN: [id number] BP 132/57 mmHg  Pulse 97  Temp(Src) 98.4 F (36.9 C) (Oral)  Resp 18  Ht 5\' 2"  (1.575 m)  Wt 58.8 kg (129 lb 10.1 oz)  BMI 23.70 kg/m2  SpO2 95% Alert, following commands Moving extremities, right greater than left Wound is clean, dry, and without signs of infection
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
## 2014-12-03 NOTE — Progress Notes (Signed)
Filed Vitals:   12/02/14 1808 12/02/14 2226 12/03/14 0218 12/03/14 0626  BP: 146/60 128/58 126/60 132/63  Pulse: 98 106 81 83  Temp: 98.1 F (36.7 C) 98.4 F (36.9 C) 98.6 F (37 C) 98 F (36.7 C)  TempSrc: Oral Axillary Axillary Oral  Resp: 20 18 18 20   Height:      Weight:      SpO2: 98% 98% 97% 98%    CBC  Recent Labs  11/30/14 1927  WBC 10.8*  HGB 10.1*  HCT 30.3*  PLT 458*   BMET  Recent Labs  11/30/14 1927  NA 145  K 3.3*  CL 116*  CO2 21  GLUCOSE 146*  BUN 15  CREATININE 0.81  CALCIUM 8.6    Patient resting comfortably in bed. Awake and alert, moving right side greater than left side. Moderate subgaleal hygroma. Incision healing nicely, clean and dry. Afebrile since January 13.  Plan: Continue supportive care.  Hosie Spangle, MD 12/03/2014, 8:12 AM

## 2014-12-04 NOTE — Progress Notes (Signed)
Neuro unchanged, some subgaleal fluid collection. SNF pending

## 2014-12-04 NOTE — Progress Notes (Signed)
CCMD alerts showing pt in V-tach, all leads checked and in place, pt asleep during this episode.  MD alerted, pt now back in NSR, no distress noted.  Will continue to monitor.

## 2014-12-05 ENCOUNTER — Inpatient Hospital Stay (HOSPITAL_COMMUNITY): Payer: Medicare Other

## 2014-12-05 LAB — BASIC METABOLIC PANEL
Anion gap: 11 (ref 5–15)
BUN: 9 mg/dL (ref 6–23)
CALCIUM: 8.2 mg/dL — AB (ref 8.4–10.5)
CHLORIDE: 106 meq/L (ref 96–112)
CO2: 22 mmol/L (ref 19–32)
CREATININE: 0.53 mg/dL (ref 0.50–1.35)
GFR calc Af Amer: >90 mL/min (ref >90)
Glucose, Bld: 121 mg/dL — ABNORMAL HIGH (ref 70–99)
Potassium: 3.5 mmol/L (ref 3.5–5.1)
Sodium: 139 mmol/L (ref 135–145)

## 2014-12-05 LAB — CBC
HCT: 28.2 % — ABNORMAL LOW (ref 39.0–52.0)
Hemoglobin: 9.4 g/dL — ABNORMAL LOW (ref 13.0–17.0)
MCH: 29.7 pg (ref 26.0–34.0)
MCHC: 33.3 g/dL (ref 30.0–36.0)
MCV: 89.2 fL (ref 78.0–100.0)
PLATELETS: 573 10*3/uL — AB (ref 150–400)
RBC: 3.16 MIL/uL — ABNORMAL LOW (ref 4.22–5.81)
RDW: 14.3 % (ref 11.5–15.5)
WBC: 8.6 10*3/uL (ref 4.0–10.5)

## 2014-12-05 LAB — VANCOMYCIN, TROUGH: VANCOMYCIN TR: 6.4 ug/mL — AB (ref 10.0–20.0)

## 2014-12-05 MED ORDER — VANCOMYCIN HCL 500 MG IV SOLR
500.0000 mg | Freq: Two times a day (BID) | INTRAVENOUS | Status: DC
  Administered 2014-12-05 – 2014-12-06 (×2): 500 mg via INTRAVENOUS
  Filled 2014-12-05 (×3): qty 500

## 2014-12-05 MED ORDER — DOXYCYCLINE HYCLATE 100 MG PO TABS: 100.0000 mg | ORAL_TABLET | Freq: Two times a day (BID) | ORAL | Status: DC

## 2014-12-05 NOTE — Progress Notes (Signed)
Patient ID: William [name], male   DOB: [birth year], 81 y.o.   MRN: [id number] Subjective:  The patient is alert and in no apparent distress. He is accompanied by his daughter who translates for [place]. She says the patient will answer simple questions. She does not feel he is at his baseline.  Objective: Vital signs in last 24 hours: Temp:  [97.5 F (36.4 C)-100.7 F (38.2 C)] 98.7 F (37.1 C) (01/18 1004) Pulse Rate:  [73-99] 85 (01/18 1004) Resp:  [18-20] 20 (01/18 1004) BP: (121-145)/(53-64) 137/56 mmHg (01/18 1004) SpO2:  [94 %-100 %] 100 % (01/18 1004)  Intake/Output from previous day: 01/17 0701 - 01/18 0700 In: 240 [P.O.:240] Out: 1800 [Urine:1800] Intake/Output this shift: Total I/O In: 300 [P.O.:300] Out: 1000 [Urine:1000]  Physical exam the patient is alert. There is a language barrier he follows commands and moves all 4 extremities. He has some left hemiparesthesias. He will squeeze my hand on the left. His pupils are equal.  The patient has a cutaneous fluid collection under his craniotomy flap which is tense. There is no drainage from his wound.  Lab Results: No results for input(s): WBC, HGB, HCT, PLT in the last 72 hours. BMET No results for input(s): NA, K, CL, CO2, GLUCOSE, BUN, CREATININE, CALCIUM in the last 72 hours.  Studies/Results: No results found.  Assessment/Plan: Status post craniotomy: The patient has not changed much since I last saw him him up but according to his daughter he is not at his baseline. I will repeat his head CT to rule out worsening subdural hygroma/hematoma and BMP to check on his sodium.  LOS: 11 days     William [name] D 12/05/2014, 1:35 PM

## 2014-12-05 NOTE — Clinical Social Work Note (Signed)
Clinical Social Worker informed that pt is not medically stable for discharge at this time. Patient has a bed at [facility], [place] and [place].   FL-2 signed and DC packet prepared. CSW will continue to follow pt and pt's family for continued support and to facilitate pt's discharge once medically stable.   Glendon Axe, MSW, LCSWA [phone] 12/05/2014 3:05 PM
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
## 2014-12-05 NOTE — Progress Notes (Signed)
Patient ID: William [name], male   DOB: 10-30-1934, 81 y.o.   MRN: [id number] BP 137/56 mmHg  Pulse 85  Temp(Src) 98.7 F (37.1 C) (Oral)  Resp 20  Ht 5\' 2"  (1.575 m)  Wt 58.8 kg (129 lb 10.1 oz)  BMI 23.70 kg/m2  SpO2 100% Alert, currently eating Was able to repeat what I said to him.  Scan shows subdural hygroma. I do not think his brain will expand, and he may need a burr hole to drain the fluid. Certainly he is better neurologically however.  White count is improved, remains on vancomycin.

## 2014-12-06 MED ORDER — SULFAMETHOXAZOLE-TRIMETHOPRIM 800-160 MG PO TABS
1.0000 | ORAL_TABLET | Freq: Two times a day (BID) | ORAL | Status: AC
  Administered 2014-12-06 – 2014-12-09 (×6): 1 via ORAL
  Filled 2014-12-06 (×7): qty 1

## 2014-12-06 MED ORDER — PRO-STAT SUGAR FREE PO LIQD
30.0000 mL | Freq: Two times a day (BID) | ORAL | Status: DC
  Administered 2014-12-06 – 2014-12-11 (×8): 30 mL via ORAL
  Filled 2014-12-06 (×14): qty 30

## 2014-12-06 NOTE — Progress Notes (Signed)
Patient ID: William [name], male   DOB: 1934-04-08, 81 y.o.   MRN: [id number] BP 125/57 mmHg  Pulse 94  Temp(Src) 98.3 F (36.8 C) (Axillary)  Resp 20  Ht 5\' 2"  (1.575 m)  Wt 58.8 kg (129 lb 10.1 oz)  BMI 23.70 kg/m2  SpO2 96% Lethargic, tracking with eyes Increased fluid underneath flap Will take to the operating room for a burr hole tomorrow.

## 2014-12-06 NOTE — Progress Notes (Signed)
Patient has rectal temp of 101.8 administered tylenol suppository at 2200 will continue to monitor.
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
## 2014-12-06 NOTE — Progress Notes (Signed)
UR complete.  Kedar Sedano RN, MSN 
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
## 2014-12-07 ENCOUNTER — Inpatient Hospital Stay (HOSPITAL_COMMUNITY): Payer: Medicare Other | Admitting: Certified Registered"

## 2014-12-07 ENCOUNTER — Encounter (HOSPITAL_COMMUNITY)
Admission: AD | Disposition: A | Discharge status: Skilled Nursing Facility | Payer: Self-pay | Source: Other Acute Inpatient Hospital | Attending: Neurosurgery

## 2014-12-07 ENCOUNTER — Encounter (HOSPITAL_COMMUNITY): Payer: Self-pay | Admitting: Certified Registered"

## 2014-12-07 HISTORY — PX: BURR HOLE: SHX908

## 2014-12-07 LAB — GLUCOSE, CAPILLARY: Glucose-Capillary: 103 mg/dL — ABNORMAL HIGH (ref 70–99)

## 2014-12-07 SURGERY — CREATION, CRANIAL BURR HOLE
Anesthesia: General | Site: Head | Laterality: Right

## 2014-12-07 MED ORDER — ONDANSETRON HCL 4 MG/2ML IJ SOLN
INTRAMUSCULAR | Status: DC | PRN
  Administered 2014-12-07: 4 mg via INTRAVENOUS

## 2014-12-07 MED ORDER — ARTIFICIAL TEARS OP OINT
TOPICAL_OINTMENT | OPHTHALMIC | Status: DC | PRN
  Administered 2014-12-07: 1 via OPHTHALMIC

## 2014-12-07 MED ORDER — NEOSTIGMINE METHYLSULFATE 10 MG/10ML IV SOLN
INTRAVENOUS | Status: AC
  Filled 2014-12-07: qty 1

## 2014-12-07 MED ORDER — EPHEDRINE SULFATE 50 MG/ML IJ SOLN
INTRAMUSCULAR | Status: DC | PRN
  Administered 2014-12-07: 15 mg via INTRAVENOUS
  Administered 2014-12-07: 10 mg via INTRAVENOUS

## 2014-12-07 MED ORDER — LIDOCAINE HCL (CARDIAC) 20 MG/ML IV SOLN
INTRAVENOUS | Status: DC | PRN
  Administered 2014-12-07: 60 mg via INTRAVENOUS

## 2014-12-07 MED ORDER — ROCURONIUM BROMIDE 100 MG/10ML IV SOLN
INTRAVENOUS | Status: DC | PRN
  Administered 2014-12-07: 20 mg via INTRAVENOUS
  Administered 2014-12-07: 10 mg via INTRAVENOUS

## 2014-12-07 MED ORDER — PROPOFOL 10 MG/ML IV BOLUS
INTRAVENOUS | Status: DC | PRN
  Administered 2014-12-07: 130 mg via INTRAVENOUS

## 2014-12-07 MED ORDER — ARTIFICIAL TEARS OP OINT
TOPICAL_OINTMENT | OPHTHALMIC | Status: AC
  Filled 2014-12-07: qty 3.5

## 2014-12-07 MED ORDER — CEFAZOLIN SODIUM-DEXTROSE 2-3 GM-% IV SOLR
INTRAVENOUS | Status: DC | PRN
  Administered 2014-12-07: 2 g via INTRAVENOUS

## 2014-12-07 MED ORDER — PHENYLEPHRINE 40 MCG/ML (10ML) SYRINGE FOR IV PUSH (FOR BLOOD PRESSURE SUPPORT)
PREFILLED_SYRINGE | INTRAVENOUS | Status: AC
  Filled 2014-12-07: qty 10

## 2014-12-07 MED ORDER — GLYCOPYRROLATE 0.2 MG/ML IJ SOLN
INTRAMUSCULAR | Status: AC
  Filled 2014-12-07: qty 2

## 2014-12-07 MED ORDER — NEOSTIGMINE METHYLSULFATE 10 MG/10ML IV SOLN
INTRAVENOUS | Status: DC | PRN
  Administered 2014-12-07: 3 mg via INTRAVENOUS

## 2014-12-07 MED ORDER — 0.9 % SODIUM CHLORIDE (POUR BTL) OPTIME
TOPICAL | Status: DC | PRN
  Administered 2014-12-07 (×2): 1000 mL

## 2014-12-07 MED ORDER — FENTANYL CITRATE 0.05 MG/ML IJ SOLN
INTRAMUSCULAR | Status: DC | PRN
  Administered 2014-12-07: 25 ug via INTRAVENOUS

## 2014-12-07 MED ORDER — PHENYLEPHRINE HCL 10 MG/ML IJ SOLN
INTRAMUSCULAR | Status: AC
  Filled 2014-12-07: qty 1

## 2014-12-07 MED ORDER — LEVETIRACETAM IN NACL 500 MG/100ML IV SOLN
500.0000 mg | Freq: Two times a day (BID) | INTRAVENOUS | Status: DC
  Administered 2014-12-07 – 2014-12-09 (×4): 500 mg via INTRAVENOUS
  Filled 2014-12-07 (×5): qty 100

## 2014-12-07 MED ORDER — PHENYLEPHRINE HCL 10 MG/ML IJ SOLN
INTRAMUSCULAR | Status: DC | PRN
  Administered 2014-12-07: 80 ug via INTRAVENOUS

## 2014-12-07 MED ORDER — FENTANYL CITRATE 0.05 MG/ML IJ SOLN
25.0000 ug | INTRAMUSCULAR | Status: DC | PRN
  Administered 2014-12-11: 50 ug via INTRAVENOUS
  Filled 2014-12-07: qty 2

## 2014-12-07 MED ORDER — ROCURONIUM BROMIDE 50 MG/5ML IV SOLN
INTRAVENOUS | Status: AC
  Filled 2014-12-07: qty 1

## 2014-12-07 MED ORDER — ONDANSETRON HCL 4 MG/2ML IJ SOLN: 4.0000 mg | Freq: Once | INTRAMUSCULAR | Status: AC | PRN

## 2014-12-07 MED ORDER — ONDANSETRON HCL 4 MG/2ML IJ SOLN
INTRAMUSCULAR | Status: AC
  Filled 2014-12-07: qty 2

## 2014-12-07 MED ORDER — PROPOFOL 10 MG/ML IV BOLUS
INTRAVENOUS | Status: AC
  Filled 2014-12-07: qty 20

## 2014-12-07 MED ORDER — GLYCOPYRROLATE 0.2 MG/ML IJ SOLN
INTRAMUSCULAR | Status: DC | PRN
  Administered 2014-12-07: 0.4 mg via INTRAVENOUS

## 2014-12-07 MED ORDER — SUCCINYLCHOLINE CHLORIDE 20 MG/ML IJ SOLN
INTRAMUSCULAR | Status: DC | PRN
  Administered 2014-12-07: 100 mg via INTRAVENOUS

## 2014-12-07 MED ORDER — SODIUM CHLORIDE 0.9 % IV SOLN
INTRAVENOUS | Status: DC | PRN
  Administered 2014-12-07: 17:00:00 via INTRAVENOUS

## 2014-12-07 MED ORDER — PHENYLEPHRINE HCL 10 MG/ML IJ SOLN
10.0000 mg | INTRAVENOUS | Status: DC | PRN
  Administered 2014-12-07: 20 ug/min via INTRAVENOUS

## 2014-12-07 MED ORDER — LIDOCAINE HCL (CARDIAC) 20 MG/ML IV SOLN
INTRAVENOUS | Status: AC
  Filled 2014-12-07: qty 5

## 2014-12-07 MED ORDER — FENTANYL CITRATE 0.05 MG/ML IJ SOLN
INTRAMUSCULAR | Status: AC
  Filled 2014-12-07: qty 5

## 2014-12-07 SURGICAL SUPPLY — 73 items
BENZOIN TINCTURE PRP APPL 2/3 (GAUZE/BANDAGES/DRESSINGS) IMPLANT
BLADE CLIPPER SURG (BLADE) ×3 IMPLANT
BLADE ULTRA TIP 2M (BLADE) ×3 IMPLANT
BNDG GAUZE ELAST 4 BULKY (GAUZE/BANDAGES/DRESSINGS) IMPLANT
BRUSH SCRUB EZ 1% IODOPHOR (MISCELLANEOUS) ×3 IMPLANT
BUR ACORN 6.0 PRECISION (BURR) ×2 IMPLANT
BUR ACORN 6.0MM PRECISION (BURR) ×1
BUR ADDG 1.1 (BURR) IMPLANT
BUR ADDG 1.1MM (BURR)
BUR MATCHSTICK NEURO 3.0 LAGG (BURR) IMPLANT
BUR ROUTER D-58 CRANI (BURR) IMPLANT
CANISTER SUCT 3000ML (MISCELLANEOUS) ×3 IMPLANT
CATH VENTRIC 35X38 W/TROCAR LG (CATHETERS) ×3 IMPLANT
CLIP TI MEDIUM 6 (CLIP) IMPLANT
CONT SPEC 4OZ CLIKSEAL STRL BL (MISCELLANEOUS) ×6 IMPLANT
COVER MAYO STAND STRL (DRAPES) ×3 IMPLANT
DRAIN SNY WOU 7FLT (WOUND CARE) IMPLANT
DRAPE NEUROLOGICAL W/INCISE (DRAPES) ×3 IMPLANT
DRAPE SURG 17X23 STRL (DRAPES) IMPLANT
DRAPE WARM FLUID 44X44 (DRAPE) ×3 IMPLANT
DRSG OPSITE POSTOP 4X6 (GAUZE/BANDAGES/DRESSINGS) ×3 IMPLANT
DRSG TELFA 3X8 NADH (GAUZE/BANDAGES/DRESSINGS) ×3 IMPLANT
DURAPREP 6ML APPLICATOR 50/CS (WOUND CARE) ×3 IMPLANT
ELECT CAUTERY BLADE 6.4 (BLADE) ×3 IMPLANT
ELECT REM PT RETURN 9FT ADLT (ELECTROSURGICAL) ×3
ELECTRODE REM PT RTRN 9FT ADLT (ELECTROSURGICAL) ×1 IMPLANT
EVACUATOR 1/8 PVC DRAIN (DRAIN) IMPLANT
EVACUATOR SILICONE 100CC (DRAIN) IMPLANT
GAUZE SPONGE 4X4 12PLY STRL (GAUZE/BANDAGES/DRESSINGS) ×3 IMPLANT
GAUZE SPONGE 4X4 16PLY XRAY LF (GAUZE/BANDAGES/DRESSINGS) IMPLANT
GLOVE BIO SURGEON STRL SZ 6.5 (GLOVE) ×4 IMPLANT
GLOVE BIO SURGEONS STRL SZ 6.5 (GLOVE) ×2
GLOVE BIOGEL PI IND STRL 7.0 (GLOVE) ×2 IMPLANT
GLOVE BIOGEL PI INDICATOR 7.0 (GLOVE) ×4
GLOVE ECLIPSE 6.5 STRL STRAW (GLOVE) ×3 IMPLANT
GLOVE EXAM NITRILE LRG STRL (GLOVE) IMPLANT
GLOVE EXAM NITRILE MD LF STRL (GLOVE) IMPLANT
GLOVE EXAM NITRILE XL STR (GLOVE) IMPLANT
GLOVE EXAM NITRILE XS STR PU (GLOVE) IMPLANT
GOWN STRL REUS W/ TWL LRG LVL3 (GOWN DISPOSABLE) ×2 IMPLANT
GOWN STRL REUS W/ TWL XL LVL3 (GOWN DISPOSABLE) IMPLANT
GOWN STRL REUS W/TWL 2XL LVL3 (GOWN DISPOSABLE) IMPLANT
GOWN STRL REUS W/TWL LRG LVL3 (GOWN DISPOSABLE) ×4
GOWN STRL REUS W/TWL XL LVL3 (GOWN DISPOSABLE)
HEMOSTAT SURGICEL 2X14 (HEMOSTASIS) IMPLANT
KIT BASIN OR (CUSTOM PROCEDURE TRAY) ×3 IMPLANT
KIT DRAIN CSF ACCUDRAIN (MISCELLANEOUS) ×3 IMPLANT
KIT ROOM TURNOVER OR (KITS) ×3 IMPLANT
NEEDLE HYPO 25X1 1.5 SAFETY (NEEDLE) ×3 IMPLANT
NS IRRIG 1000ML POUR BTL (IV SOLUTION) ×3 IMPLANT
PACK CRANIOTOMY (CUSTOM PROCEDURE TRAY) ×3 IMPLANT
PATTIES SURGICAL .5 X.5 (GAUZE/BANDAGES/DRESSINGS) IMPLANT
PATTIES SURGICAL .5 X3 (DISPOSABLE) IMPLANT
PATTIES SURGICAL 1X1 (DISPOSABLE) IMPLANT
SPONGE NEURO XRAY DETECT 1X3 (DISPOSABLE) IMPLANT
SPONGE SURGIFOAM ABS GEL 100 (HEMOSTASIS) ×3 IMPLANT
STAPLER VISISTAT 35W (STAPLE) ×3 IMPLANT
SUT ETHILON 3 0 FSL (SUTURE) IMPLANT
SUT ETHILON 3 0 PS 1 (SUTURE) IMPLANT
SUT NURALON 4 0 TR CR/8 (SUTURE) ×9 IMPLANT
SUT PL GUT 3 0 FS 1 (SUTURE) IMPLANT
SUT STEEL 0 (SUTURE)
SUT STEEL 0 18XMFL TIE 17 (SUTURE) IMPLANT
SUT VIC AB 2-0 CT2 18 VCP726D (SUTURE) ×6 IMPLANT
SYR CONTROL 10ML LL (SYRINGE) ×3 IMPLANT
TAPE CLOTH SURG 4X10 WHT LF (GAUZE/BANDAGES/DRESSINGS) ×3 IMPLANT
TOWEL OR 17X24 6PK STRL BLUE (TOWEL DISPOSABLE) ×3 IMPLANT
TOWEL OR 17X26 10 PK STRL BLUE (TOWEL DISPOSABLE) ×3 IMPLANT
TRAY FOLEY CATH 14FRSI W/METER (CATHETERS) ×3 IMPLANT
TUBE CONNECTING 12'X1/4 (SUCTIONS) ×1
TUBE CONNECTING 12X1/4 (SUCTIONS) ×2 IMPLANT
UNDERPAD 30X30 INCONTINENT (UNDERPADS AND DIAPERS) ×3 IMPLANT
WATER STERILE IRR 1000ML POUR (IV SOLUTION) ×3 IMPLANT

## 2014-12-07 NOTE — Transfer of Care (Signed)
Immediate Anesthesia Transfer of Care Note  Patient: William [name]  Procedure(s) Performed: Procedure(s): BURR HOLES for Subdural Hygroma (Right)  Patient Location: PACU  Anesthesia Type:General  Level of Consciousness: lethargic and responds to stimulation  Airway & Oxygen Therapy: Patient Spontanous Breathing and Patient connected to face mask oxygen  Post-op Assessment: Report given to PACU RN  Post vital signs: Reviewed and stable  Complications: No apparent anesthesia complications

## 2014-12-07 NOTE — Anesthesia Procedure Notes (Signed)
Procedure Name: Intubation Date/Time: 12/07/2014 5:09 PM Performed by: Sampson Si E Pre-anesthesia Checklist: Patient identified, Emergency Drugs available, Suction available, Patient being monitored and Timeout performed Patient Re-evaluated:Patient Re-evaluated prior to inductionOxygen Delivery Method: Circle system utilized Preoxygenation: Pre-oxygenation with 100% oxygen Intubation Type: IV induction, Rapid sequence and Cricoid Pressure applied Laryngoscope Size: Mac and 3 Grade View: Grade I Tube type: Oral Tube size: 7.5 mm Number of attempts: 1 Airway Equipment and Method: Stylet Placement Confirmation: ETT inserted through vocal cords under direct vision,  positive ETCO2 and breath sounds checked- equal and bilateral Secured at: 22 cm Tube secured with: Tape Dental Injury: Teeth and Oropharynx as per pre-operative assessment

## 2014-12-07 NOTE — Progress Notes (Signed)
OT Cancellation Note  Patient Details Name: ESTEN DOLLAR MRN: [id number] DOB: Feb 19, 1934   Cancelled Treatment:    Reason Eval/Treat Not Completed: Medical issues which prohibited therapy. As noted in chart and PT cancellation note, patient with increased lethargy and plan for burr hole today.   OT is signing off at this time. Please re-order OT as appropriate. Thanks.   Shaquita Fort 12/07/2014, 1:53 PM

## 2014-12-07 NOTE — Progress Notes (Signed)
PT Cancellation and Discharge Note  Patient Details Name: William [name] MRN: [id number] DOB: [birth year]   Cancelled Treatment:    Reason Eval/Treat Not Completed: Medical issues which prohibited therapy--noted incr lethargy and plan for burr hole today.   PT is signing off at this time. If pt becomes appropriate for PT consult, please re-order and address weight-bearing status in bil legs/feet.   [name] 12/07/2014, 10:03 AM  Pager [phone]

## 2014-12-07 NOTE — Anesthesia Postprocedure Evaluation (Signed)
  Anesthesia Post-op Note  Patient: William [name]  Procedure(s) Performed: Procedure(s): BURR HOLES for Subdural Hygroma (Right)  Patient Location: PACU and NICU  Anesthesia Type:General  Level of Consciousness: sedated  Airway and Oxygen Therapy: Patient remains intubated per anesthesia plan  Post-op Pain: mild  Post-op Assessment: Post-op Vital signs reviewed  Post-op Vital Signs: Reviewed  Last Vitals:  Filed Vitals:   12/07/14 1930  BP: 120/57  Pulse: 73  Temp:   Resp: 17    Complications: No apparent anesthesia complications

## 2014-12-07 NOTE — Anesthesia Preprocedure Evaluation (Addendum)
Anesthesia Evaluation  Patient identified by MRN, date of birth, ID band Patient awake    Reviewed: Allergy & Precautions, NPO status , Patient's Chart, lab work & pertinent test results, reviewed documented beta blocker date and time   Airway Mallampati: III  TM Distance: >3 FB Neck ROM: Full    Dental   Pulmonary former smoker,          Cardiovascular hypertension, Pt. on medications  EKG 08/2014 OK   Neuro/Psych Confusion and decreased mental status with current pathology    GI/Hepatic negative GI ROS, Neg liver ROS,   Endo/Other  diabetes, Type 2  Renal/GU      Musculoskeletal   Abdominal   Peds  Hematology  (+) anemia ,   Anesthesia Other Findings   Reproductive/Obstetrics                            Anesthesia Physical  Anesthesia Plan  ASA: III  Anesthesia Plan: General   Post-op Pain Management:    Induction: Intravenous  Airway Management Planned: Oral ETT  Additional Equipment:   Intra-op Plan:   Post-operative Plan: Possible Post-op intubation/ventilation  Informed Consent: I have reviewed the patients History and Physical, chart, labs and discussed the procedure including the risks, benefits and alternatives for the proposed anesthesia with the patient or authorized representative who has indicated his/her understanding and acceptance.     Plan Discussed with: CRNA  Anesthesia Plan Comments:        Anesthesia Quick Evaluation

## 2014-12-07 NOTE — Op Note (Signed)
11/24/2014 - 12/07/2014  6:51 PM  PATIENT:  William [name]  81 y.o. male status post recurrent meningioma resection. He has a subdural hygroma which has persisted. I have recommended that a subdural drain be placed and his family has agreed.   PRE-OPERATIVE DIAGNOSIS:  Subdural Hygroma  POST-OPERATIVE DIAGNOSIS:  Subdural Hygroma  PROCEDURE:  Procedure(s): Right frontal subdural drain placement  via [place] for Subdural Hygroma   SURGEON: Surgeon(s): Ashok Pall, MD  ASSISTANTS:none  ANESTHESIA:   general  EBL:  Total I/O In: 600 [I.V.:600] Out: 200 [Urine:200]  BLOOD ADMINISTERED:none  COUNT:per nursing  DRAINS: Ventriculostomy Drain in the subdural space   SPECIMEN:  No Specimen  DICTATION: [name] was taken to the operating room, intubated, and placed under a general anesthetic without difficulty. He had his head shaved and prepped in a sterile manner. [name] was positioned supine with his head slightly elevated.  I removed the staples from the very anterior portion of his craniotomy incision. I opened the scalp with Metzenbaum scissors and placed a Weitlaner retractor to expose the skull. I created a burr hole with a power drill in the right frontal bone. I opened the dura after cauterizing the surface. I used a 15 blade to make a small cruciate incision. I threaded a ventricular catheter into the subdural space tand immediately had brisk return of mostly clear fluid. I tunneled the catheter posteromedial to the incision and brought it out of the scalp. I closed the scalp with subgaleal sutures, then staples for the scalp edges. I secured the catheter at its exit site with a vicryl suture. I placed a sterile dressing over the incision. He was extubated and was at baseline.  PLAN OF CARE: Admit to inpatient   PATIENT DISPOSITION:  PACU - hemodynamically stable.   Delay start of Pharmacological VTE agent (>24hrs) due to surgical blood loss or risk of bleeding:  yes

## 2014-12-07 NOTE — Clinical Documentation Improvement (Signed)
Presents with recurrent hemangioma, hygroma; craniotomy for evacuation of hygroma and resection performed.   Patient is described as lethargic with increased fluid underneath flap  "11 mm midline shift to the left with some trapping of the left lateral ventricle unchanged. Intraventricular hemorrhage shows mild interval improvement" noted in CT Head on 1/18.   Trudee Kuster hole planned for today  Please clarify if you agree with intraventricular hemorrhage as documented by Dr. Carlis Abbott and document findings in next progress note and include in discharge summary if applicable.  Thank You, Zoila Shutter ,RN Clinical Documentation Specialist:  [place] Information Management

## 2014-12-07 NOTE — Progress Notes (Signed)
William [name], [phone],  called and made aware of patient being transferred to OR for surgery. Requesting Dr Ashok Pall  to call him when procedure is completed. Sticky note updated for MD.

## 2014-12-08 ENCOUNTER — Encounter (HOSPITAL_COMMUNITY): Payer: Self-pay | Admitting: Neurosurgery

## 2014-12-08 NOTE — Clinical Social Work Note (Signed)
Clinical Social Worker continuing to follow patient and family for support and discharge planning needs.  CSW spoke with admissions coordinator at [facility] who states that patient bed is still available when medically ready for discharge.  CSW remains available for support and to facilitate patient discharge needs once medically ready.  Barbette Or, [place]

## 2014-12-08 NOTE — Progress Notes (Signed)
Patient ID: William [name], male   DOB: 05-Aug-1934, 81 y.o.   MRN: [id number] BP 126/70 mmHg  Pulse 77  Temp(Src) 98.5 F (36.9 C) (Oral)  Resp 15  Ht 5\' 2"  (1.575 m)  Wt 58.8 kg (129 lb 10.1 oz)  BMI 23.70 kg/m2  SpO2 99% Alert, following commands Scalp flap is sinking to the skull. Draining well from catheter. Overall significant improvement. Will obtain CT scan on Sunday.

## 2014-12-08 NOTE — Progress Notes (Signed)
UR completed.  Trevionne Advani, RN BSN MHA CCM Trauma/Neuro ICU Case Manager [phone]  

## 2014-12-09 LAB — CULTURE, BLOOD (ROUTINE X 2)
CULTURE: NO GROWTH
Culture: NO GROWTH

## 2014-12-09 NOTE — Clinical Documentation Improvement (Signed)
Presents with recurrent hemangioma, hygroma; craniotomy for evacuation of hygroma and resection performed.  Patient is described as lethargic with increased fluid underneath flap "11 mm midline shift to the left with some trapping of the left lateral ventricle unchanged. Intraventricular hemorrhage shows mild interval improvement" noted in CT Head on 1/18.  Trudee Kuster hole planned for today  Please clarify if you agree with intraventricular hemorrhage as documented by Dr. Carlis Abbott and document findings in next progress note and include in discharge summary if applicable.   Thank You, Zoila Shutter ,RN Clinical Documentation Specialist:  [place] Information Management

## 2014-12-10 MED ORDER — DOCUSATE SODIUM 100 MG PO CAPS
100.0000 mg | ORAL_CAPSULE | Freq: Two times a day (BID) | ORAL | Status: DC
  Administered 2014-12-10 – 2014-12-28 (×17): 100 mg via ORAL
  Filled 2014-12-10 (×33): qty 1

## 2014-12-10 NOTE — Progress Notes (Signed)
Patient ID: William [name], male   DOB: 1933-12-03, 81 y.o.   MRN: [id number] BP 118/67 mmHg  Pulse 84  Temp(Src) 98.4 F (36.9 C) (Axillary)  Resp 19  Ht 5\' 2"  (1.575 m)  Wt 58.8 kg (129 lb 10.1 oz)  BMI 23.70 kg/m2  SpO2 99% Alert and oriented, following commands Raised head of bed today Drain continues to work well. CT ordered for Sunday Moving extremities, perrl Wound is clean, dry, without signs of infection.

## 2014-12-10 NOTE — Progress Notes (Signed)
No new issues or problems. Drain working well. Patient awake and alert. He follows commands bilaterally. Left-sided hemiparesis unchanged.  Status post evacuation of right-sided subdural hygroma/chronic subdural hematoma. Continue drain. Follow-up head CT scan tomorrow.

## 2014-12-11 MED ORDER — SODIUM CHLORIDE 0.9 % IV SOLN
INTRAVENOUS | Status: DC
Start: 2014-12-11 — End: 2014-12-24
  Administered 2014-12-11: 75 mL/h via INTRAVENOUS
  Administered 2014-12-13 – 2014-12-14 (×2): via INTRAVENOUS
  Administered 2014-12-16 – 2014-12-17 (×2): 75 mL/h via INTRAVENOUS
  Administered 2014-12-17 – 2014-12-18 (×3): via INTRAVENOUS
  Administered 2014-12-19: 75 mL/h via INTRAVENOUS
  Administered 2014-12-21 – 2014-12-24 (×5): via INTRAVENOUS

## 2014-12-11 NOTE — Progress Notes (Signed)
No new events or problems. Patient continues to slowly improve. Left-sided strength seems improved a more spontaneous today. Wound clean and dry. Drain output lessening. Plan follow-up head CT scan in morning.

## 2014-12-12 ENCOUNTER — Inpatient Hospital Stay (HOSPITAL_COMMUNITY): Payer: Medicare Other

## 2014-12-12 ENCOUNTER — Encounter (HOSPITAL_COMMUNITY): Payer: Self-pay | Admitting: Radiology
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
## 2014-12-12 NOTE — Clinical Social Work Note (Signed)
Clinical Social Worker continuing to follow patient and family for support and discharge planning needs.  Patient has a bed at [facility] pending bed availability at time of discharge.  Patient is scheduled to have a ventric placed.  CSW spoke with RN and inpatient rehab admissions coordinator regarding potential weight bearing status for patient needs and disposition at discharge.  CSW remains available for support and to facilitate patient discharge needs once medically ready.  Barbette Or, [place]

## 2014-12-12 NOTE — Progress Notes (Signed)
Patient ID: William [name], male   DOB: [birth year], 81 y.o.   MRN: [id number] BP 140/65 mmHg  Pulse 89  Temp(Src) 97.4 F (36.3 C) (Axillary)  Resp 18  Ht 5\' 2"  (1.575 m)  Wt 58.8 kg (129 lb 10.1 oz)  BMI 23.70 kg/m2  SpO2 99% Alert and oriented , following some commands Flap is flat and closely opposed to skull Repeat ct shows no change intracranially, air fluid level right anterior fossa, middle fossa Large hygroma, unchanged

## 2014-12-13 NOTE — Progress Notes (Signed)
Patient ID: William [name], male   DOB: 1933-12-18, 81 y.o.   MRN: [id number] BP 137/90 mmHg  Pulse 102  Temp(Src) 100.6 F (38.1 C) (Axillary)  Resp 13  Ht 5\' 2"  (1.575 m)  Wt 58.8 kg (129 lb 10.1 oz)  BMI 23.70 kg/m2  SpO2 98% Alert and oriented Moving all extremities perrl Drain working well, flap remains apposed to skull Will clamp drain tomorrow.

## 2014-12-14 ENCOUNTER — Inpatient Hospital Stay (HOSPITAL_COMMUNITY): Payer: Medicare Other

## 2014-12-14 NOTE — Clinical Social Work Note (Signed)
Clinical Social Worker continuing to follow patient and family for support and discharge planning needs. Patient has a bed at [facility] pending bed availability at time of discharge. Patient is now on a nonbreather. CSW spoke with RN and inpatient rehab admissions coordinator regarding potential weight bearing status for patient needs and disposition at discharge. CSW remains available for support and to facilitate patient discharge needs once medically ready.  William [name], [place]

## 2014-12-14 NOTE — Progress Notes (Signed)
PT Cancellation Note  Patient Details Name: William [name] MRN: [id number] DOB: Feb 16, 1934   Cancelled Treatment:    Reason Eval/Treat Not Completed: Patient at procedure or test/unavailable   Duncan Dull 12/14/2014, 9:09 AM Alben Deeds, PT DPT  [phone]

## 2014-12-14 NOTE — Progress Notes (Signed)
Dr Vertell Limber notified that there has been minimum output from drain. No new orders given. Will continue to monitor.

## 2014-12-14 NOTE — Progress Notes (Signed)
Patient ID: William [name], male   DOB: Feb 26, 1934, 81 y.o.   MRN: [id number] BP 151/72 mmHg  Pulse 92  Temp(Src) 100.4 F (38 C) (Axillary)  Resp 19  Ht 5\' 2"  (1.575 m)  Wt 58.8 kg (129 lb 10.1 oz)  BMI 23.70 kg/m2  SpO2 100% Lethargic, arouses to voice Moving extremities Head CT revealed significant pneumocephalus, placed on O2 by mask today He is improved since this am.  Will turn o2 off this evening, monitor exam closely

## 2014-12-14 NOTE — Progress Notes (Signed)
PT Cancellation Note  Patient Details Name: William [name] MRN: [id number] DOB: [birth year]   Cancelled Treatment:     Patient with increased lethargy and pneumocephalus per new CT. Will hold therapies at this time.   Duncan Dull 12/14/2014, 1:01 PM Alben Deeds, [place] DPT  [phone]

## 2014-12-14 NOTE — Progress Notes (Signed)
Drain clamped and 100% O2 via face mask applied per order.

## 2014-12-14 NOTE — Progress Notes (Signed)
Pt not as alert today. Drain output decreased. MD notified.

## 2014-12-14 NOTE — Progress Notes (Signed)
OT Cancellation Note  Patient Details Name: William [name] MRN: [id number] DOB: 24-Jun-1934   Cancelled Treatment:    Reason Eval/Treat Not Completed: Medical issues which prohibited therapy.  Malka So 12/14/2014, 10:43 AM

## 2014-12-15 MED ORDER — COLLAGENASE 250 UNIT/GM EX OINT
TOPICAL_OINTMENT | Freq: Every day | CUTANEOUS | Status: DC
  Administered 2014-12-15 – 2014-12-16 (×2): via TOPICAL
  Administered 2014-12-17: 1 via TOPICAL
  Administered 2014-12-18: 10:00:00 via TOPICAL
  Administered 2014-12-19: 1 via TOPICAL
  Administered 2014-12-20: 05:00:00 via TOPICAL
  Filled 2014-12-15: qty 30
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
## 2014-12-15 NOTE — Progress Notes (Signed)
Patient ID: ASH MCELWAIN, male   DOB: 11-May-1934, 81 y.o.   MRN: [id number] BP 129/84 mmHg  Pulse 80  Temp(Src) 98.7 F (37.1 C) (Oral)  Resp 21  Ht 5\' 2"  (1.575 m)  Wt 58.8 kg (129 lb 10.1 oz)  BMI 23.70 kg/m2  SpO2 96% Lethargic, arouses to voice. Moving extremities, right greater than left Wound is clean, dry, no signs of infection Will remove drain CT tomorrow.
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
## 2014-12-16 ENCOUNTER — Encounter (HOSPITAL_COMMUNITY): Payer: Self-pay | Admitting: Radiology

## 2014-12-16 ENCOUNTER — Inpatient Hospital Stay (HOSPITAL_COMMUNITY): Payer: Medicare Other

## 2014-12-16 NOTE — Progress Notes (Signed)
Patient ID: William [name], male   DOB: 10/01/1934, 81 y.o.   MRN: [id number] BP 140/81 mmHg  Pulse 89  Temp(Src) 99.5 F (37.5 C) (Axillary)  Resp 18  Ht 5\' 2"  (1.575 m)  Wt 58.8 kg (129 lb 10.1 oz)  BMI 23.70 kg/m2  SpO2 100% Alert, tracking, paucity of speech Flap is sunken CT slightly better. Brain still will not fill space, be it air or fluid Drain removed tonight without difficulty.

## 2014-12-16 NOTE — Progress Notes (Signed)
UR completed.  Planned for SNF at discharge. CSW aware and prepared for transfer when pt medically clear.  Sandi Mariscal, RN BSN [place] CCM Trauma/Neuro ICU Case Manager [phone]
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
## 2014-12-16 NOTE — Progress Notes (Signed)
Pt ate less than 20 % of breakfast but was able to drink his resource Breeze with his meds. Pt is currently refusing to drink anything from his lunch tray and will not attempt to even take a spoonful of his lunch. Pt does not appear to be in any distress or pain, will continue to try again in just a bit.   William [name]

## 2014-12-16 NOTE — Clinical Social Work Note (Signed)
Clinical Social Worker continuing to follow patient and family for support and discharge planning needs. Patient has a bed at [facility] pending bed availability at time of discharge. CSW spoke with RN and inpatient rehab admissions coordinator regarding potential weight bearing status for patient needs and disposition at discharge. CSW remains available for support and to facilitate patient discharge needs once medically ready.  Barbette Or, [place]

## 2014-12-17 LAB — CLOSTRIDIUM DIFFICILE BY PCR: Toxigenic C. Difficile by PCR: NEGATIVE

## 2014-12-17 NOTE — Progress Notes (Signed)
Pt has made great strides to eat during this shift. Pt's daughter has been here during 2 meal times and he has responded well to the meal items she brought, she was also able to help with communication and encourage pt to participate in mouthcare, range of motion exercises. Pt is currently eating dinner and has had 2 magic cups so far today as well as his breeze supplement.   Educated and encouraged pt's daughter about how important his nutritional intake is in his healing process. Daughter understands and communicated with the pt on this topic as well.

## 2014-12-17 NOTE — Progress Notes (Signed)
Pts IVC drain was removed 1/29 and pm RN reported 2 episodes of drainage from the site.  This RN also noticed this happen several times during the day and asked Dr. Kathyrn Sheriff to come to the bedside to witness. Bottom sheet under pt head was saturated, gauze applied to head was wet as well as the recently place bed pan placed under his head.   At the time that Dr. Kathyrn Sheriff was at the bedside the site was not actively draining. However, he just suggested [place] to keep an eye on it and pay attention to the behaviors that may instigate drainage such as movement, repositioning, etc.  William [name]

## 2014-12-17 NOTE — Progress Notes (Signed)
Pt seen and examined. No issues overnight. Pt does not communicate  EXAM: Temp:  [98.1 F (36.7 C)-99.5 F (37.5 C)] 98.8 F (37.1 C) (01/30 0800) Pulse Rate:  [64-106] 69 (01/30 0800) Resp:  [11-28] 28 (01/30 0800) BP: (91-149)/(46-92) 127/49 mmHg (01/30 0800) SpO2:  [95 %-100 %] 100 % (01/30 0800) Intake/Output      01/29 0701 - 01/30 0700 01/30 0701 - 01/31 0700   P.O. 50 540   I.V. (mL/kg) 1650 (28.1) 300 (5.1)   Total Intake(mL/kg) 1700 (28.9) 840 (14.3)   Urine (mL/kg/hr) 1995 (1.4) 275 (0.9)   Stool 0 (0)    Total Output 1995 275   Net -295 +565        Stool Occurrence 1 x     Awake, alert, does say name Tracks Moves BUE/BLE spontaneously, does not reliably follow commands Wound dry, although dressing appears to show some drainage overnight  LABS: Lab Results  Component Value Date   CREATININE 0.53 12/05/2014   BUN 9 12/05/2014   NA 139 12/05/2014   K 3.5 12/05/2014   CL 106 12/05/2014   CO2 22 12/05/2014   Lab Results  Component Value Date   WBC 8.6 12/05/2014   HGB 9.4* 12/05/2014   HCT 28.2* 12/05/2014   MCV 89.2 12/05/2014   PLT 573* 12/05/2014    IMPRESSION: - 81 y.o. male s/p resection of meningioma, postop hygroma has not responded to drainage, remains encephalopathic - Poor caloric intake  PLAN: - Cont current mgmt - Cont encouragement for PO intake, may need TF if he doesn't eat.

## 2014-12-18 ENCOUNTER — Encounter (HOSPITAL_COMMUNITY): Payer: Self-pay

## 2014-12-18 NOTE — Progress Notes (Signed)
Pt seen and examined. No issues overnight. Pt able to eat more yesterday.  EXAM: Temp:  [97.4 F (36.3 C)-99.6 F (37.6 C)] 98.5 F (36.9 C) (01/31 0800) Pulse Rate:  [56-117] 59 (01/31 0900) Resp:  [12-21] 15 (01/31 0900) BP: (106-166)/(38-95) 114/52 mmHg (01/31 0900) SpO2:  [76 %-100 %] 100 % (01/31 0900) Intake/Output      01/30 0701 - 01/31 0700 01/31 0701 - 02/01 0700   P.O. 1760    I.V. (mL/kg) 1800 (30.6) 150 (2.6)   Total Intake(mL/kg) 3560 (60.5) 150 (2.6)   Urine (mL/kg/hr) 1720 (1.2) 325 (2)   Stool 0 (0)    Total Output 1720 325   Net +1840 -175        Stool Occurrence 1 x     Awake, alert Speaks some, although largely unintelligible Moves all extremities spontaneously Wound appears dry, however bedsheet appears stained with drainage.  LABS: Lab Results  Component Value Date   CREATININE 0.53 12/05/2014   BUN 9 12/05/2014   NA 139 12/05/2014   K 3.5 12/05/2014   CL 106 12/05/2014   CO2 22 12/05/2014   Lab Results  Component Value Date   WBC 8.6 12/05/2014   HGB 9.4* 12/05/2014   HCT 28.2* 12/05/2014   MCV 89.2 12/05/2014   PLT 573* 12/05/2014    IMPRESSION: - 81 y.o. male s/p resection of meningioma with postop hygroma unchanged by drainage. - Stable encephalopathy - ? Drainage from wound - Poor caloric intake  PLAN: - Cont to observe for wound drainage. Unclear if this represents HCP or possibly just serous drainage. Will cont to observe in ICU. - Family to cont to encourage PO intake

## 2014-12-19 NOTE — Progress Notes (Signed)
UR completed.  Craige Patel, RN BSN [facility] CCM Trauma/Neuro ICU Case Manager [phone]  
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
## 2014-12-19 NOTE — Progress Notes (Signed)
Patient ID: William [name], male   DOB: 18-Dec-1933, 81 y.o.   MRN: [id number] BP 149/72 mmHg  Pulse 101  Temp(Src) 97.5 F (36.4 C) (Oral)  Resp 16  Ht 5\' 2"  (1.575 m)  Wt 58.8 kg (129 lb 10.1 oz)  BMI 23.70 kg/m2  SpO2 100% Alert and oriented x 4 Moving all extremities Doing well  Will transfer tomorrow

## 2014-12-19 NOTE — Progress Notes (Signed)
Pt only ate a few bites from the breakfast tray, but when pt's daughter showed up he was able to eat and drink more throughout the day. The daughter brought food from home which patient clearly enjoys and tolerates better than the hospital food. The daughter stayed through 2 mealtimes again and plans to return tomorrow with food he will like to eat.  Pt has been cooperative in drinking his nutritional supplements and taking his medications.

## 2014-12-19 NOTE — Clinical Social Work Note (Signed)
Clinical Social Worker continuing to follow patient and family for support and discharge planning needs. Patient has a bed at [facility] pending bed availability at time of discharge. CSW spoke with RN regarding questions concerning patient potential weight bearing status - RN to speak with MD regarding this issue. CSW remains available for support and to facilitate patient discharge needs once medically ready.  William [name], William [name]

## 2014-12-20 NOTE — Progress Notes (Signed)
Patient ID: William [name], male   DOB: 07-18-1934, 81 y.o.   MRN: [id number] BP 139/70 mmHg  Pulse 106  Temp(Src) 99.9 F (37.7 C) (Axillary)  Resp 24  Ht 5\' 2"  (1.575 m)  Wt 58.8 kg (129 lb 10.1 oz)  BMI 23.70 kg/m2  SpO2 96% More lethargic today  Wounds are clean, dry Will scan tomorrow due to change, had been quite good yesterday and through the weekend.  Low grade temp- will give tylenol

## 2014-12-20 NOTE — Consult Note (Signed)
WOC wound consult note Reason for Consult: Follow-up for sacral wound.  Per previous note, pt was noted to have a stage 2 to buttocks on admission and also a unstageable wound to sacrum. Pt is frequently incontinent of stool and it is difficult to keep wound from becoming soiled, also he is emaciated with multiple systemic factors which can impair healing. Wound type: Deep tissue injury with evolution into an unstageable wound over sacrum; 3X1X0.2cm, no drainage or odor noted; loosening yellow slough noted to wound-85% yellow, 15% red wound bed with granulation noted. Right buttock with stage 2 wound; 3X2X0.1cm, 100% red, no odor or drainage noted. Pressure Ulcer POA: Yes Periwound: Blancheable redness Dressing procedure/placement/frequency: Santyl applied to chemically debride nonviable tissue to sacrum. Foam dressing reapplied over both sites to protect and repel moisture.  Pt placed on right side with pillow support. Pt is on a Sport low airloss bed to reduce pressure. No family present at bedside to discuss plan of care and pt does not understand.  Orson Gear, BSN, RN-BC, Graduate Student Please re-consult if further assistance is needed.  Thank-you,  Julien Girt MSN, [place], [place], [place], [place]
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
## 2014-12-20 NOTE — Progress Notes (Signed)
Rehab Admissions Coordinator Note:  Patient was screened by Cleatrice Burke for appropriateness for an Inpatient Acute Rehab Consult per OT recommendation after she clarified weight bearing restrictions with [facility] provider for BLE.  At this time, we are recommending Inpatient Rehab consult if felt with weight bearing restrictions being lifted that pt can tolerate more intense therapies than offered at SNF level. I will follow to see how he does with PT at next session.   Cleatrice Burke 12/20/2014, 3:54 PM  I can be reached at [phone].
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
## 2014-12-21 ENCOUNTER — Inpatient Hospital Stay (HOSPITAL_COMMUNITY): Payer: Medicare Other

## 2014-12-21 MED ORDER — COLLAGENASE 250 UNIT/GM EX OINT
TOPICAL_OINTMENT | Freq: Every day | CUTANEOUS | Status: DC
  Administered 2014-12-21 – 2014-12-27 (×7): via TOPICAL
  Administered 2014-12-28: 1 via TOPICAL
  Filled 2014-12-21: qty 30

## 2014-12-21 MED ORDER — VANCOMYCIN HCL IN DEXTROSE 750-5 MG/150ML-% IV SOLN
750.0000 mg | Freq: Two times a day (BID) | INTRAVENOUS | Status: DC
  Administered 2014-12-21 – 2014-12-23 (×4): 750 mg via INTRAVENOUS
  Filled 2014-12-21 (×6): qty 150

## 2014-12-21 MED ORDER — TAMSULOSIN HCL 0.4 MG PO CAPS
0.4000 mg | ORAL_CAPSULE | Freq: Two times a day (BID) | ORAL | Status: DC
  Administered 2014-12-21 – 2014-12-23 (×6): 0.4 mg via ORAL
  Filled 2014-12-21 (×9): qty 1

## 2014-12-21 NOTE — Progress Notes (Signed)
PT Cancellation Note  Patient Details Name: ADETOKUNBO MCCADDEN MRN: [id number] DOB: 18-Aug-1934   Cancelled Treatment:    Reason Eval/Treat Not Completed: Medical issues which prohibited therapy.  MD note indicates pt more lethargic and wanting to scan today.  Will hold PT at this time and await scanning or clearance by MD.  Will continue to follow along.     Delayni Streed, Thornton Papas 12/21/2014, 8:32 AM
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
## 2014-12-22 MED ORDER — SODIUM CHLORIDE 0.9 % IV BOLUS (SEPSIS)
500.0000 mL | Freq: Once | INTRAVENOUS | Status: AC
  Administered 2014-12-22: 500 mL via INTRAVENOUS
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
## 2014-12-22 NOTE — Progress Notes (Signed)
Noted pt is WBAT to BLE , but not following commands for therapy with interpreter and daughter present. Pt continues to need SNF at this time. I will follow pt's progress to assess if his functional status improves. [phone]

## 2014-12-22 NOTE — Progress Notes (Signed)
Chaplain initiated visit with pt daughter, William [name]. Pt family reports no spiritual needs at this time. Page chaplain as needed.    12/22/14 1100  Clinical Encounter Type  Visited With Family  Visit Type Initial;Spiritual support  Spiritual Encounters  Spiritual Needs Emotional  Stress Factors  Family Stress Factors Exhausted  William [name], Barbette Hair, [place] 12/22/2014 11:50 AM

## 2014-12-22 NOTE — Progress Notes (Signed)
Patient ID: William [name], male   DOB: [birth year], 81 y.o.   MRN: [id number] BP 136/64 mmHg  Pulse 114  Temp(Src) 98.5 F (36.9 C) (Oral)  Resp 23  Ht 5\' 2"  (1.575 m)  Wt 59.6 kg (131 lb 6.3 oz)  BMI 24.03 kg/m2  SpO2 91% Lethargic responds to voice, following commands No longer febrile. Awaiting culture results Stable. Wound is clean, dry, no signs infection.

## 2014-12-22 NOTE — Clinical Social Work Note (Signed)
Clinical Social Worker continuing to follow patient and family for support and discharge planning needs. Patient has a bed at [facility] pending bed availability at time of discharge. CSW spoke with RN again regarding questions concerning patient potential weight bearing status - RN to speak with MD again regarding this issue. CSW remains available for support and to facilitate patient discharge needs once medically ready.  Barbette Or, [place]

## 2014-12-22 NOTE — Progress Notes (Signed)
Spoke with Dr. Christella Noa regarding patient's mobility status.  Patient is now allowed to bear weight on both lower extremities with no restrictions. [place], [place]

## 2014-12-23 LAB — COMPREHENSIVE METABOLIC PANEL
ALT: 70 U/L — ABNORMAL HIGH (ref 0–53)
ANION GAP: 2 — AB (ref 5–15)
AST: 83 U/L — ABNORMAL HIGH (ref 0–37)
Albumin: 2.1 g/dL — ABNORMAL LOW (ref 3.5–5.2)
Alkaline Phosphatase: 85 U/L (ref 39–117)
BILIRUBIN TOTAL: 0.6 mg/dL (ref 0.3–1.2)
BUN: 11 mg/dL (ref 6–23)
CHLORIDE: 107 mmol/L (ref 96–112)
CO2: 32 mmol/L (ref 19–32)
CREATININE: 0.53 mg/dL (ref 0.50–1.35)
Calcium: 8.1 mg/dL — ABNORMAL LOW (ref 8.4–10.5)
GFR calc non Af Amer: >90 mL/min (ref >90)
GLUCOSE: 135 mg/dL — AB (ref 70–99)
POTASSIUM: 2.5 mmol/L — AB (ref 3.5–5.1)
Sodium: 141 mmol/L (ref 135–145)
TOTAL PROTEIN: 5.1 g/dL — AB (ref 6.0–8.3)

## 2014-12-23 LAB — CBC
HCT: 26.1 % — ABNORMAL LOW (ref 39.0–52.0)
HEMOGLOBIN: 8.8 g/dL — AB (ref 13.0–17.0)
MCH: 29.7 pg (ref 26.0–34.0)
MCHC: 33.7 g/dL (ref 30.0–36.0)
MCV: 88.2 fL (ref 78.0–100.0)
Platelets: 252 10*3/uL (ref 150–400)
RBC: 2.96 MIL/uL — ABNORMAL LOW (ref 4.22–5.81)
RDW: 14.4 % (ref 11.5–15.5)
WBC: 10.3 10*3/uL (ref 4.0–10.5)

## 2014-12-23 LAB — URINE CULTURE
Colony Count: >=100000
Special Requests: NORMAL

## 2014-12-23 MED ORDER — CEFTAZIDIME 1 G IJ SOLR
1.0000 g | Freq: Three times a day (TID) | INTRAMUSCULAR | Status: DC
  Administered 2014-12-23 – 2014-12-29 (×19): 1 g via INTRAVENOUS
  Filled 2014-12-23 (×23): qty 1

## 2014-12-23 MED ORDER — POTASSIUM CHLORIDE 10 MEQ/100ML IV SOLN
10.0000 meq | INTRAVENOUS | Status: AC
  Administered 2014-12-23 (×3): 10 meq via INTRAVENOUS
  Filled 2014-12-23 (×3): qty 100

## 2014-12-23 NOTE — Progress Notes (Signed)
Changed sacral dressing over pressure ulcer.  Noted purulent, green discharge.  Cleansed, covered with santyl and added new pink foam dressing. Called wound care nurse.  She verified that the daily santyl and pink foam dressing change was correct treatment for wound. [place], [place]

## 2014-12-23 NOTE — Progress Notes (Signed)
ANTIBIOTIC CONSULT NOTE - INITIAL  Pharmacy Consult for ceftazidime Indication: UTI  No Known Allergies  Patient Measurements: Height: 5\' 2"  (157.5 cm) Weight: 127 lb 3.3 oz (57.7 kg) IBW/kg (Calculated) : 54.6 Adjusted Body Weight:   Vital Signs: Temp: 100.8 F (38.2 C) (02/05 1430) Temp Source: Axillary (02/05 1430) BP: 119/53 mmHg (02/05 1500) Pulse Rate: 81 (02/05 1500) Intake/Output from previous day: 02/04 0701 - 02/05 0700 In: 3040 [P.O.:240; I.V.:1800; IV Piggyback:1000] Out: 2300 [Urine:2300] Intake/Output from this shift: Total I/O In: 720 [P.O.:120; I.V.:600] Out: 650 [Urine:650]  Labs:  Recent Labs  12/23/14 0238  WBC 10.3  HGB 8.8*  PLT 252  CREATININE 0.53   Estimated Creatinine Clearance: 58.8 mL/min (by C-G formula based on Cr of 0.53). No results for input(s): VANCOTROUGH, VANCOPEAK, VANCORANDOM, GENTTROUGH, GENTPEAK, GENTRANDOM, TOBRATROUGH, TOBRAPEAK, TOBRARND, AMIKACINPEAK, AMIKACINTROU, AMIKACIN in the last 72 hours.   Microbiology: Recent Results (from the past 720 hour(s))  MRSA PCR Screening     Status: Abnormal   Collection Time: 11/24/14 10:21 AM  Result Value Ref Range Status   MRSA by PCR POSITIVE (A) NEGATIVE Final    Comment:        The GeneXpert MRSA Assay (FDA approved for NASAL specimens only), is one component of a comprehensive MRSA colonization surveillance program. It is not intended to diagnose MRSA infection nor to guide or monitor treatment for MRSA infections. RESULT CALLED TO, READ BACK BY AND VERIFIED WITH: BKathrin Ruddy RN 13:35 11/24/14 ([email or web address])   Urine culture     Status: None   Collection Time: 11/30/14  5:44 PM  Result Value Ref Range Status   Specimen Description URINE, CATHETERIZED  Final   Special Requests NONE  Final   Colony Count   Final    >=100,000 COLONIES/ML Performed at [facility]    Culture   Final    METHICILLIN RESISTANT STAPHYLOCOCCUS AUREUS Note: RIFAMPIN AND GENTAMICIN  SHOULD NOT BE USED AS SINGLE DRUGS FOR TREATMENT OF STAPH INFECTIONS. Performed at [facility]    Report Status 12/02/2014 FINAL  Final   Organism ID, Bacteria METHICILLIN RESISTANT STAPHYLOCOCCUS AUREUS  Final      Susceptibility   Methicillin resistant staphylococcus aureus - MIC*    GENTAMICIN <=0.5 SENSITIVE Sensitive     LEVOFLOXACIN >=8 RESISTANT Resistant     NITROFURANTOIN <=16 SENSITIVE Sensitive     OXACILLIN >=4 RESISTANT Resistant     PENICILLIN >=0.5 RESISTANT Resistant     RIFAMPIN <=0.5 SENSITIVE Sensitive     TRIMETH/SULFA <=10 SENSITIVE Sensitive     VANCOMYCIN 1 SENSITIVE Sensitive     TETRACYCLINE <=1 SENSITIVE Sensitive     * METHICILLIN RESISTANT STAPHYLOCOCCUS AUREUS  Culture, blood (routine x 2)     Status: None   Collection Time: 12/02/14  7:20 PM  Result Value Ref Range Status   Specimen Description BLOOD LEFT HAND  Final   Special Requests BOTTLES DRAWN AEROBIC ONLY 3CC  Final   Culture   Final    NO GROWTH 5 DAYS Performed at [facility]    Report Status 12/09/2014 FINAL  Final  Culture, blood (routine x 2)     Status: None   Collection Time: 12/02/14  7:27 PM  Result Value Ref Range Status   Specimen Description BLOOD RIGHT HAND  Final   Special Requests BOTTLES DRAWN AEROBIC ONLY 5CC  Final   Culture   Final    NO GROWTH 5 DAYS Performed at [facility]  Report Status 12/09/2014 FINAL  Final  Clostridium Difficile by PCR     Status: None   Collection Time: 12/17/14 10:00 AM  Result Value Ref Range Status   C difficile by pcr NEGATIVE NEGATIVE Final  Urine culture     Status: None (Preliminary result)   Collection Time: 12/21/14  5:10 PM  Result Value Ref Range Status   Specimen Description URINE, CATHETERIZED  Final   Special Requests Normal  Final   Colony Count   Final    >=100,000 COLONIES/ML Performed at [facility]    Culture   Final    PSEUDOMONAS AERUGINOSA Performed at [facility]     Report Status PENDING  Incomplete  Culture, blood (routine x 2)     Status: None (Preliminary result)   Collection Time: 12/21/14  7:25 PM  Result Value Ref Range Status   Specimen Description BLOOD RIGHT HAND  Final   Special Requests   Final    BOTTLES DRAWN AEROBIC AND ANAEROBIC 5CC AER,4CC ANA   Culture   Final           BLOOD CULTURE RECEIVED NO GROWTH TO DATE CULTURE WILL BE HELD FOR 5 DAYS BEFORE ISSUING A FINAL NEGATIVE REPORT Performed at [facility]    Report Status PENDING  Incomplete  Culture, blood (routine x 2)     Status: None (Preliminary result)   Collection Time: 12/21/14  8:22 PM  Result Value Ref Range Status   Specimen Description BLOOD LEFT HAND  Final   Special Requests BOTTLES DRAWN AEROBIC ONLY 10CC  Final   Culture   Final           BLOOD CULTURE RECEIVED NO GROWTH TO DATE CULTURE WILL BE HELD FOR 5 DAYS BEFORE ISSUING A FINAL NEGATIVE REPORT Performed at [facility]    Report Status PENDING  Incomplete    Medical History: Past Medical History  Diagnosis Date  . High cholesterol   . Meningioma     recurrent; involving right sphenoid wing  . Diabetes mellitus without complication   . Type 2 diabetes mellitus   . Hypertension     does not see a cardiologist  . Dementia     Medications:  Anti-infectives    Start     Dose/Rate Route Frequency Ordered Stop   12/23/14 1600  cefTAZidime (FORTAZ) 1 g in dextrose 5 % 50 mL IVPB     1 g100 mL/hr over 30 Minutes Intravenous Every 8 hours 12/23/14 1530     12/21/14 1700  vancomycin (VANCOCIN) IVPB 750 mg/150 ml premix  Status:  Discontinued     750 mg150 mL/hr over 60 Minutes Intravenous Every 12 hours 12/21/14 1624 12/23/14 1523   12/06/14 1000  sulfamethoxazole-trimethoprim (BACTRIM DS,SEPTRA DS) 800-160 MG per tablet 1 tablet     1 tablet Oral Every 12 hours 12/06/14 0950 12/10/14 0959   12/05/14 2200  doxycycline (VIBRA-TABS) tablet 100 mg  Status:  Discontinued     100 mg Oral  Every 12 hours 12/05/14 1508 12/05/14 1537   12/05/14 2000  vancomycin (VANCOCIN) 500 mg in sodium chloride 0.9 % 100 mL IVPB  Status:  Discontinued     500 mg100 mL/hr over 60 Minutes Intravenous Every 12 hours 12/05/14 1538 12/06/14 0950   12/02/14 2000  vancomycin (VANCOCIN) 500 mg in sodium chloride 0.9 % 100 mL IVPB  Status:  Discontinued     500 mg100 mL/hr over 60 Minutes Intravenous Every 12 hours  12/02/14 1729 12/05/14 1508   11/29/14 2245  clindamycin (CLEOCIN) IVPB 600 mg  Status:  Discontinued     600 mg100 mL/hr over 30 Minutes Intravenous 3 times per day 11/29/14 2231 11/30/14 1544   11/26/14 2200  clindamycin (CLEOCIN) IVPB 600 mg  Status:  Discontinued     600 mg100 mL/hr over 30 Minutes Intravenous 3 times per day 11/26/14 2141 11/28/14 2158   11/26/14 0915  clindamycin (CLEOCIN) IVPB 600 mg  Status:  Discontinued     600 mg100 mL/hr over 30 Minutes Intravenous 3 times per day 11/26/14 0904 11/29/14 2231   11/24/14 1630  clindamycin (CLEOCIN) capsule 600 mg  Status:  Discontinued     600 mg Oral 3 times daily 11/24/14 1601 11/26/14 0906     Assessment: 80 yom to start ceftazidime for UTI. Growing pseudomonas in the urine but sensitivities are still pending. Pt with Tmax of 101.3 and WBC WNL.   Ceftaz 2/5>> Vanc 1/15>>1/18; 2/3>>2/5 Bactrim 1/18>>1/22  1/30 C.diff - NEG 1/15 BCx x2 - NEG 1/13 UCx - MRSA 2/3 Urine - >100K PSA 2/3 Blood - NGTD  Goal of Therapy:  Eradication of infection  Plan:  1. Ceftazidime 1gm IV Q8H 2. F/u renal fxn, C&S, clinical status  Sontee Desena, Rande Lawman 12/23/2014,3:31 PM

## 2014-12-23 NOTE — Progress Notes (Signed)
Report called to nurse receiving the patient on 4N.  Vitals stable, meds and belongings packed.  Will transfer in bed, no family at bedside.

## 2014-12-23 NOTE — Progress Notes (Signed)
CRITICAL VALUE ALERT  Critical value received: K+ 2.5  Date of notification:  12/23/14 Time of notification:  0315  Critical value read back:Yes.    Nurse who received alert:  Verdie Drown  MD notified (1st page):  Dr. Hal Neer  Time of first page:  0405  MD notified (2nd page):  Time of second page:  Responding MD: Dr. Hal Neer Time MD responded:  [phone]

## 2014-12-23 NOTE — Progress Notes (Signed)
Patient ID: William [name], male   DOB: 05-Feb-1934, 81 y.o.   MRN: [id number] BP 127/44 mmHg  Pulse 97  Temp(Src) 100.1 F (37.8 C) (Axillary)  Resp 19  Ht 5\' 2"  (1.575 m)  Wt 57.7 kg (127 lb 3.3 oz)  BMI 23.26 kg/m2  SpO2 98% Lethargic, easily arouses to voice Moving all extremities Wound is clean, flat and dry Pseudomonas in urine, switched to ceftaz. Blood cx negative Will transfer.

## 2014-12-23 NOTE — Progress Notes (Signed)
Pt arrived to room 4N29 from neuro ICU.  Pt is alert but is mute.  Overlay mattress ordered for pt.  Pt placed on contact precautions.  Will continue to monitor.

## 2014-12-24 LAB — BASIC METABOLIC PANEL
Anion gap: 9 (ref 5–15)
BUN: 8 mg/dL (ref 6–23)
CO2: 28 mmol/L (ref 19–32)
Calcium: 8.3 mg/dL — ABNORMAL LOW (ref 8.4–10.5)
Chloride: 103 mmol/L (ref 96–112)
Creatinine, Ser: 0.48 mg/dL — ABNORMAL LOW (ref 0.50–1.35)
GFR calc non Af Amer: >90 mL/min (ref >90)
GLUCOSE: 119 mg/dL — AB (ref 70–99)
Potassium: 2.9 mmol/L — ABNORMAL LOW (ref 3.5–5.1)
SODIUM: 140 mmol/L (ref 135–145)

## 2014-12-24 MED ORDER — TAMSULOSIN HCL 0.4 MG PO CAPS
0.4000 mg | ORAL_CAPSULE | Freq: Every day | ORAL | Status: DC
  Administered 2014-12-24 – 2014-12-29 (×6): 0.4 mg via ORAL
  Filled 2014-12-24 (×6): qty 1

## 2014-12-24 MED ORDER — POTASSIUM CHLORIDE 10 MEQ/100ML IV SOLN
10.0000 meq | INTRAVENOUS | Status: AC
  Administered 2014-12-24 (×4): 10 meq via INTRAVENOUS
  Filled 2014-12-24 (×4): qty 100

## 2014-12-24 MED ORDER — POTASSIUM CHLORIDE IN NACL 20-0.9 MEQ/L-% IV SOLN
INTRAVENOUS | Status: DC
  Administered 2014-12-24 – 2014-12-26 (×4): via INTRAVENOUS
  Administered 2014-12-27: 1000 mL via INTRAVENOUS
  Administered 2014-12-27: 08:00:00 via INTRAVENOUS
  Administered 2014-12-29: 1000 mL via INTRAVENOUS
  Filled 2014-12-24 (×9): qty 1000

## 2014-12-24 NOTE — Progress Notes (Signed)
Subjective: Patient reports attends, but not vocalizing  Objective: Vital signs in last 24 hours: Temp:  [98.1 F (36.7 C)-101.3 F (38.5 C)] 98.1 F (36.7 C) (02/06 0759) Pulse Rate:  [76-110] 100 (02/06 0544) Resp:  [12-26] 20 (02/06 0544) BP: (110-161)/(44-75) 142/75 mmHg (02/06 0544) SpO2:  [96 %-99 %] 97 % (02/06 0544)  Intake/Output from previous day: 02/05 0701 - 02/06 0700 In: 1355 [P.O.:480; I.V.:825; IV Piggyback:50] Out: 850 [Urine:850] Intake/Output this shift:    Physical Exam: Will follow commands.  No spontaneous speech.  Wounds CDI.    Lab Results:  Recent Labs  12/23/14 0238  WBC 10.3  HGB 8.8*  HCT 26.1*  PLT 252   BMET  Recent Labs  12/23/14 0238 12/24/14 0553  NA 141 140  K 2.5* 2.9*  CL 107 103  CO2 32 28  GLUCOSE 135* 119*  BUN 11 8  CREATININE 0.53 0.48*  CALCIUM 8.1* 8.3*    Studies/Results: No results found.  Assessment/Plan: K 2.9.  Will replace and recheck in AM.  Continue therapies.  Patient stable clinically.    LOS: 30 days    Peggyann Shoals, MD 12/24/2014, 9:19 AM

## 2014-12-24 NOTE — Progress Notes (Signed)
MD has been paged with regards to patient's Potassium being 2.9.  Awaiting for further orders.  Will continue to monitor patient.

## 2014-12-25 LAB — BASIC METABOLIC PANEL
ANION GAP: 3 — AB (ref 5–15)
BUN: 9 mg/dL (ref 6–23)
CO2: 32 mmol/L (ref 19–32)
Calcium: 8.3 mg/dL — ABNORMAL LOW (ref 8.4–10.5)
Chloride: 106 mmol/L (ref 96–112)
Creatinine, Ser: 0.45 mg/dL — ABNORMAL LOW (ref 0.50–1.35)
GFR calc Af Amer: >90 mL/min (ref >90)
GFR calc non Af Amer: >90 mL/min (ref >90)
GLUCOSE: 113 mg/dL — AB (ref 70–99)
POTASSIUM: 4.1 mmol/L (ref 3.5–5.1)
Sodium: 141 mmol/L (ref 135–145)

## 2014-12-25 NOTE — Progress Notes (Signed)
Patient ID: William [name], male   DOB: 12-26-1933, 81 y.o.   MRN: [id number] Vital signs are stable Potassium is up to 4 today Family's in attendance and patient appears to be more alert opening eyes and interacting with them Family feels the patient is doing better

## 2014-12-26 NOTE — Progress Notes (Signed)
ANTIBIOTIC CONSULT NOTE - FOLLOW UP  Pharmacy Consult for Ceftazidime Indication: Pseudomonas UTI  No Known Allergies  Patient Measurements: Height: 5\' 2"  (157.5 cm) Weight: 127 lb 3.3 oz (57.7 kg) IBW/kg (Calculated) : 54.6 Adjusted Body Weight:   Vital Signs: Temp: 97.9 F (36.6 C) (02/08 1424) Temp Source: Oral (02/08 1424) BP: 118/63 mmHg (02/08 1424) Pulse Rate: 91 (02/08 1424) Intake/Output from previous day: 02/07 0701 - 02/08 0700 In: 300 [P.O.:300] Out: 2000 [Urine:2000] Intake/Output from this shift: Total I/O In: 80 [P.O.:80] Out: 1300 [Urine:1300]  Labs:  Recent Labs  12/24/14 0553 12/25/14 0615  CREATININE 0.48* 0.45*   Estimated Creatinine Clearance: 58.8 mL/min (by C-G formula based on Cr of 0.45). No results for input(s): VANCOTROUGH, VANCOPEAK, VANCORANDOM, GENTTROUGH, GENTPEAK, GENTRANDOM, TOBRATROUGH, TOBRAPEAK, TOBRARND, AMIKACINPEAK, AMIKACINTROU, AMIKACIN in the last 72 hours.   Microbiology: Recent Results (from the past 720 hour(s))  Urine culture     Status: None   Collection Time: 11/30/14  5:44 PM  Result Value Ref Range Status   Specimen Description URINE, CATHETERIZED  Final   Special Requests NONE  Final   Colony Count   Final    >=100,000 COLONIES/ML Performed at [facility]    Culture   Final    METHICILLIN RESISTANT STAPHYLOCOCCUS AUREUS Note: RIFAMPIN AND GENTAMICIN SHOULD NOT BE USED AS SINGLE DRUGS FOR TREATMENT OF STAPH INFECTIONS. Performed at [facility]    Report Status 12/02/2014 FINAL  Final   Organism ID, Bacteria METHICILLIN RESISTANT STAPHYLOCOCCUS AUREUS  Final      Susceptibility   Methicillin resistant staphylococcus aureus - MIC*    GENTAMICIN <=0.5 SENSITIVE Sensitive     LEVOFLOXACIN >=8 RESISTANT Resistant     NITROFURANTOIN <=16 SENSITIVE Sensitive     OXACILLIN >=4 RESISTANT Resistant     PENICILLIN >=0.5 RESISTANT Resistant     RIFAMPIN <=0.5 SENSITIVE Sensitive     TRIMETH/SULFA  <=10 SENSITIVE Sensitive     VANCOMYCIN 1 SENSITIVE Sensitive     TETRACYCLINE <=1 SENSITIVE Sensitive     * METHICILLIN RESISTANT STAPHYLOCOCCUS AUREUS  Culture, blood (routine x 2)     Status: None   Collection Time: 12/02/14  7:20 PM  Result Value Ref Range Status   Specimen Description BLOOD LEFT HAND  Final   Special Requests BOTTLES DRAWN AEROBIC ONLY 3CC  Final   Culture   Final    NO GROWTH 5 DAYS Performed at [facility]    Report Status 12/09/2014 FINAL  Final  Culture, blood (routine x 2)     Status: None   Collection Time: 12/02/14  7:27 PM  Result Value Ref Range Status   Specimen Description BLOOD RIGHT HAND  Final   Special Requests BOTTLES DRAWN AEROBIC ONLY 5CC  Final   Culture   Final    NO GROWTH 5 DAYS Performed at [facility]    Report Status 12/09/2014 FINAL  Final  Clostridium Difficile by PCR     Status: None   Collection Time: 12/17/14 10:00 AM  Result Value Ref Range Status   C difficile by pcr NEGATIVE NEGATIVE Final  Urine culture     Status: None   Collection Time: 12/21/14  5:10 PM  Result Value Ref Range Status   Specimen Description URINE, CATHETERIZED  Final   Special Requests Normal  Final   Colony Count   Final    >=100,000 COLONIES/ML Performed at [place]   Final  PSEUDOMONAS AERUGINOSA Performed at [facility]    Report Status 12/23/2014 FINAL  Final   Organism ID, Bacteria PSEUDOMONAS AERUGINOSA  Final      Susceptibility   Pseudomonas aeruginosa - MIC*    CEFEPIME 4 SENSITIVE Sensitive     CEFTAZIDIME 8 SENSITIVE Sensitive     CIPROFLOXACIN <=0.25 SENSITIVE Sensitive     GENTAMICIN 2 SENSITIVE Sensitive     IMIPENEM 2 SENSITIVE Sensitive     PIP/TAZO 16 SENSITIVE Sensitive     TOBRAMYCIN <=1 SENSITIVE Sensitive     * PSEUDOMONAS AERUGINOSA  Culture, blood (routine x 2)     Status: None (Preliminary result)   Collection Time: 12/21/14  7:25 PM  Result Value Ref Range  Status   Specimen Description BLOOD RIGHT HAND  Final   Special Requests   Final    BOTTLES DRAWN AEROBIC AND ANAEROBIC 5CC AER,4CC ANA   Culture   Final           BLOOD CULTURE RECEIVED NO GROWTH TO DATE CULTURE WILL BE HELD FOR 5 DAYS BEFORE ISSUING A FINAL NEGATIVE REPORT Performed at [facility]    Report Status PENDING  Incomplete  Culture, blood (routine x 2)     Status: None (Preliminary result)   Collection Time: 12/21/14  8:22 PM  Result Value Ref Range Status   Specimen Description BLOOD LEFT HAND  Final   Special Requests BOTTLES DRAWN AEROBIC ONLY 10CC  Final   Culture   Final           BLOOD CULTURE RECEIVED NO GROWTH TO DATE CULTURE WILL BE HELD FOR 5 DAYS BEFORE ISSUING A FINAL NEGATIVE REPORT Performed at [facility]    Report Status PENDING  Incomplete    Anti-infectives    Start     Dose/Rate Route Frequency Ordered Stop   12/23/14 1600  cefTAZidime (FORTAZ) 1 g in dextrose 5 % 50 mL IVPB     1 g100 mL/hr over 30 Minutes Intravenous Every 8 hours 12/23/14 1530     12/21/14 1700  vancomycin (VANCOCIN) IVPB 750 mg/150 ml premix  Status:  Discontinued     750 mg150 mL/hr over 60 Minutes Intravenous Every 12 hours 12/21/14 1624 12/23/14 1523   12/06/14 1000  sulfamethoxazole-trimethoprim (BACTRIM DS,SEPTRA DS) 800-160 MG per tablet 1 tablet     1 tablet Oral Every 12 hours 12/06/14 0950 12/10/14 0959   12/05/14 2200  doxycycline (VIBRA-TABS) tablet 100 mg  Status:  Discontinued     100 mg Oral Every 12 hours 12/05/14 1508 12/05/14 1537   12/05/14 2000  vancomycin (VANCOCIN) 500 mg in sodium chloride 0.9 % 100 mL IVPB  Status:  Discontinued     500 mg100 mL/hr over 60 Minutes Intravenous Every 12 hours 12/05/14 1538 12/06/14 0950   12/02/14 2000  vancomycin (VANCOCIN) 500 mg in sodium chloride 0.9 % 100 mL IVPB  Status:  Discontinued     500 mg100 mL/hr over 60 Minutes Intravenous Every 12 hours 12/02/14 1729 12/05/14 1508   11/29/14 2245  clindamycin  (CLEOCIN) IVPB 600 mg  Status:  Discontinued     600 mg100 mL/hr over 30 Minutes Intravenous 3 times per day 11/29/14 2231 11/30/14 1544   11/26/14 2200  clindamycin (CLEOCIN) IVPB 600 mg  Status:  Discontinued     600 mg100 mL/hr over 30 Minutes Intravenous 3 times per day 11/26/14 2141 11/28/14 2158   11/26/14 0915  clindamycin (CLEOCIN) IVPB 600 mg  Status:  Discontinued     600 mg100 mL/hr over 30 Minutes Intravenous 3 times per day 11/26/14 0904 11/29/14 2231   11/24/14 1630  clindamycin (CLEOCIN) capsule 600 mg  Status:  Discontinued     600 mg Oral 3 times daily 11/24/14 1601 11/26/14 0906      Assessment: 81yo male on Ceftazidime day#3 for Pseudomonas UTI.  Pt is afebrile, stable Cr (2/7), and WBC wnl as of 2/5.  Blood cx from 2/3 are NTD.  Goal of Therapy:  resolution of infection  Plan:  Continue Ceftazidime 1g IV q8 Please define planned length of therapy  Gracy Bruins, PharmD Clinical Pharmacist [facility]

## 2014-12-26 NOTE — Progress Notes (Signed)
Patient ID: William [name], male   DOB: 14-Sep-1934, 81 y.o.   MRN: [id number] BP 116/55 mmHg  Pulse 83  Temp(Src) 99.1 F (37.3 C) (Oral)  Resp 20  Ht 5\' 2"  (1.575 m)  Wt 57.7 kg (127 lb 3.3 oz)  BMI 23.26 kg/m2  SpO2 95% Lethargic  and responsive to voice Following commands perrl Wound is clean, dry, no signs of infection No real improvement Very low grade temp at this time. Will resend urine
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
## 2014-12-26 NOTE — Clinical Social Work Note (Signed)
CSW received chart. CSW call Melissa at [facility] to confirm bed available for the pt. Melissa reported she can still take the pt. CSW will continue to follow and assist with discharge.       [place], MSW, [place]

## 2014-12-27 LAB — MRSA PCR SCREENING: MRSA by PCR: NEGATIVE

## 2014-12-27 LAB — URINALYSIS, ROUTINE W REFLEX MICROSCOPIC
Bilirubin Urine: NEGATIVE
Glucose, UA: NEGATIVE mg/dL
Ketones, ur: NEGATIVE mg/dL
Nitrite: POSITIVE — AB
PROTEIN: NEGATIVE mg/dL
Specific Gravity, Urine: 1.012 (ref 1.005–1.030)
UROBILINOGEN UA: 0.2 mg/dL (ref 0.0–1.0)
pH: 7 (ref 5.0–8.0)

## 2014-12-27 LAB — URINE MICROSCOPIC-ADD ON

## 2014-12-27 MED ORDER — BOOST / RESOURCE BREEZE PO LIQD
1.0000 | Freq: Two times a day (BID) | ORAL | Status: DC
  Administered 2014-12-27 – 2014-12-29 (×4): 1 via ORAL

## 2014-12-27 MED ORDER — ENSURE COMPLETE PO LIQD
237.0000 mL | ORAL | Status: DC
  Administered 2014-12-27 – 2014-12-29 (×3): 237 mL via ORAL

## 2014-12-27 NOTE — Progress Notes (Signed)
Patient ID: William [name], male   DOB: Oct 28, 1934, 81 y.o.   MRN: [id number] BP 121/72 mmHg  Pulse 86  Temp(Src) 98.7 F (37.1 C) (Oral)  Resp 20  Ht 5\' 2"  (1.575 m)  Wt 57.7 kg (127 lb 3.3 oz)  BMI 23.26 kg/m2  SpO2 97% Lethargic, arouses quickly to voice Wound is clean,dry, no signs of infection Scalp flap is flat, sunken Stable, no real progression or deterioration.
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
## 2014-12-27 NOTE — Progress Notes (Signed)
UR COMPLETED  

## 2014-12-28 LAB — CULTURE, BLOOD (ROUTINE X 2)
Culture: NO GROWTH
Culture: NO GROWTH

## 2014-12-28 NOTE — Progress Notes (Signed)
Patient ID: William [name], male   DOB: Dec 17, 1933, 81 y.o.   MRN: [id number] BP 122/64 mmHg  Pulse 92  Temp(Src) 98.6 F (37 C) (Oral)  Resp 18  Ht 5\' 2"  (1.575 m)  Wt 57.7 kg (127 lb 3.3 oz)  BMI 23.26 kg/m2  SpO2 97% Alert and oriented x 4 Moving all extremities Wound is clean, dry, no signs of infection Eating with assistance of daughter Possible discharge

## 2014-12-28 NOTE — Clinical Social Work Note (Signed)
Clinical Social Worker continues to follow pt and pt's family for continued support and to facilitate pt's discharge needs once medically stable. Will possibly discharge 2/11 to [facility] Facility and pt's family notified.   DC packet on chart for MD signature.   Glendon Axe, MSW, [place] [phone] 12/28/2014 5:08 PM

## 2014-12-28 NOTE — Progress Notes (Signed)
PHARMACIST - PHYSICIAN COMMUNICATION  CONCERNING:  William [name]   RECOMMENDATION: Please consider change to po agent  --> Cipro?  Or discontinue? Day # 6 of William [name]  Thank you. Anette Guarneri, PharmD [phone]
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
## 2014-12-29 MED ORDER — BOOST / RESOURCE BREEZE PO LIQD: 1.0000 | Freq: Two times a day (BID) | ORAL | Status: DC
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
## 2014-12-29 NOTE — Discharge Instructions (Signed)
Call if temperature greater than 101.5, change in neurologic examination

## 2014-12-29 NOTE — Clinical Social Work Note (Signed)
Patient to be discharged to [facility]. Patient's son, Melecio Cueto, updated regarding discharge.  Facility: [facility] Report number: [phone] Transportation: EMS ([address])  Lubertha Sayres, [place] ([phone]) Licensed Clinical Social Worker Orthopedics [phone]) and Surgical [phone])

## 2014-12-29 NOTE — Progress Notes (Signed)
Patient is discharged from room 4N29 at this time. Alert and in stable condition. Report called to nurse Charlett Lango at [facility]. Iv site d/c'd. Transported out of unit via stretcher by [facility] with all belongings and daughter at side.

## 2014-12-30 LAB — URINE CULTURE: Colony Count: 50000

## 2015-02-02 ENCOUNTER — Other Ambulatory Visit: Payer: Self-pay | Admitting: Neurosurgery

## 2015-02-02 DIAGNOSIS — D320 Benign neoplasm of cerebral meninges: Secondary | ICD-10-CM

## 2015-02-22 ENCOUNTER — Ambulatory Visit
Admission: RE | Admit: 2015-02-22 | Discharge: 2015-02-22 | Disposition: A | Discharge status: Home / Self Care | Payer: Medicare Other | Source: Ambulatory Visit | Attending: Neurosurgery | Admitting: Neurosurgery

## 2015-02-22 DIAGNOSIS — D320 Benign neoplasm of cerebral meninges: Secondary | ICD-10-CM

## 2015-02-22 MED ORDER — GADOBENATE DIMEGLUMINE 529 MG/ML IV SOLN
10.0000 mL | Freq: Once | INTRAVENOUS | Status: AC | PRN
  Administered 2015-02-22: 10 mL via INTRAVENOUS

## 2015-03-15 ENCOUNTER — Other Ambulatory Visit: Payer: Self-pay | Admitting: Neurosurgery

## 2015-03-22 ENCOUNTER — Inpatient Hospital Stay (HOSPITAL_COMMUNITY)
Admission: RE | Admit: 2015-03-22 | Discharge: 2015-03-22 | Disposition: A | Discharge status: Home / Self Care | Payer: Medicare Other | Source: Ambulatory Visit

## 2015-03-22 ENCOUNTER — Encounter (HOSPITAL_COMMUNITY): Payer: Self-pay | Admitting: *Deleted

## 2015-03-22 NOTE — Progress Notes (Signed)
Spoke with pt nephew Jamse Mead regarding pt missed PAT appointment scheduled for 1:00PM; pt stated that he made the family aware of the above appointment. Spoke with Nyra Jabs, pt son,  regarding missed PAT and if pt would still be able to come in today or tomorrow. Pt son stated that they knew nothing about the PAT appointment today and they have no transportation available for today and tomorrow since his dad needs special transportation due to his inability to move. LVM with Manuela Schwartz at Dr. Lacy Duverney office to make MD aware of missed PAT; pt will be made a SDW-pre-op call.

## 2015-03-22 NOTE — Pre-Procedure Instructions (Signed)
William [name]  03/22/2015   Your procedure is scheduled on:  Friday, Mar 24, 2015   Report to [facility] Admitting at 1:00 PM.( per MD)  Call this number if you have problems the morning of surgery: [phone]   Remember:   Do not eat food or drink liquids after midnight Thursday, Mar 23, 2015   Take these medicines the morning of surgery with A SIP OF WATER: amLODipine (NORVASC),  levETIRAcetam (KEPPRA)  Stop taking Aspirin, vitamins and herbal medications. Do not take any NSAIDs ie: Ibuprofen, Advil, Naproxen or any medication containing Aspirin; stop now.   Do not wear jewelry, make-up or nail polish.  Do not wear lotions, powders, or perfumes. You may not wear deodorant.  Do not shave 48 hours prior to surgery. Men may shave face and neck.  Do not bring valuables to the hospital.  [facility] is not responsible for any belongings or valuables.               Contacts, dentures or bridgework may not be worn into surgery.  Leave suitcase in the car. After surgery it may be brought to your room.  For patients admitted to the hospital, discharge time is determined by your treatment team.               Patients discharged the day of surgery will not be allowed to drive home.  Name and phone number of your driver:   Special Instructions:  Special Instructions:Special Instructions: [facility] - Preparing for Surgery  Before surgery, you can play an important role.  Because skin is not sterile, your skin needs to be as free of germs as possible.  You can reduce the number of germs on you skin by washing with CHG (chlorahexidine gluconate) soap before surgery.  CHG is an antiseptic cleaner which kills germs and bonds with the skin to continue killing germs even after washing.  Please DO NOT use if you have an allergy to CHG or antibacterial soaps.  If your skin becomes reddened/irritated stop using the CHG and inform your nurse when you arrive at Short Stay.  Do not shave  (including legs and underarms) for at least 48 hours prior to the first CHG shower.  You may shave your face.  Please follow these instructions carefully:   1.  Shower with CHG Soap the night before surgery and the morning of Surgery.  2.  If you choose to wash your hair, wash your hair first as usual with your normal shampoo.  3.  After you shampoo, rinse your hair and body thoroughly to remove the Shampoo.  4.  Use CHG as you would any other liquid soap.  You can apply chg directly  to the skin and wash gently with scrungie or a clean washcloth.  5.  Apply the CHG Soap to your body ONLY FROM THE NECK DOWN.  Do not use on open wounds or open sores.  Avoid contact with your eyes, ears, mouth and genitals (private parts).  Wash genitals (private parts) with your normal soap.  6.  Wash thoroughly, paying special attention to the area where your surgery will be performed.  7.  Thoroughly rinse your body with warm water from the neck down.  8.  DO NOT shower/wash with your normal soap after using and rinsing off the CHG Soap.  9.  Pat yourself dry with a clean towel.            10.  Wear clean pajamas.            11.  Place clean sheets on your bed the night of your first shower and do not sleep with pets.  Day of Surgery  Do not apply any lotions/deodorants the morning of surgery.  Please wear clean clothes to the hospital/surgery center.   Please read over the following fact sheets that you were given: Pain Booklet and Surgical Site Infection Prevention

## 2015-03-23 MED ORDER — CEFAZOLIN SODIUM-DEXTROSE 2-3 GM-% IV SOLR
2.0000 g | INTRAVENOUS | Status: AC
  Administered 2015-03-24: 2 g via INTRAVENOUS
  Filled 2015-03-23: qty 50

## 2015-03-23 NOTE — Progress Notes (Signed)
SDW pre- op call was completed by pt nurses Mendel Ryder ( she completed pt medical and surgical history  On 5/4 ) and Sheej ( Apnea screening and pre-op instructions on 5/5 ). Sheej made aware pt is NPO after midnight so stop tbe feed ( per [place] anesthesia). Sheej stated that pt will only be given 30cc flush with morning medications since NPO. Nurse made aware to stop otc vitamins, NSAID's and herbal medications.

## 2015-03-24 ENCOUNTER — Encounter (HOSPITAL_COMMUNITY): Payer: Self-pay | Admitting: Anesthesiology

## 2015-03-24 ENCOUNTER — Encounter (HOSPITAL_COMMUNITY)
Admission: RE | Disposition: A | Discharge status: Skilled Nursing Facility | Payer: Self-pay | Source: Ambulatory Visit | Attending: Neurosurgery

## 2015-03-24 ENCOUNTER — Inpatient Hospital Stay (HOSPITAL_COMMUNITY): Payer: Medicare Other | Admitting: Anesthesiology

## 2015-03-24 ENCOUNTER — Inpatient Hospital Stay (HOSPITAL_COMMUNITY)
Admission: RE | Admit: 2015-03-24 | Discharge: 2015-03-31 | DRG: 031 | Disposition: A | Discharge status: Skilled Nursing Facility | Payer: Medicare Other | Source: Ambulatory Visit | Attending: Neurosurgery | Admitting: Neurosurgery

## 2015-03-24 DIAGNOSIS — E440 Moderate protein-calorie malnutrition: Secondary | ICD-10-CM | POA: Diagnosis present

## 2015-03-24 DIAGNOSIS — Z6821 Body mass index (BMI) 21.0-21.9, adult: Secondary | ICD-10-CM

## 2015-03-24 DIAGNOSIS — E119 Type 2 diabetes mellitus without complications: Secondary | ICD-10-CM | POA: Diagnosis present

## 2015-03-24 DIAGNOSIS — G919 Hydrocephalus, unspecified: Principal | ICD-10-CM | POA: Diagnosis present

## 2015-03-24 DIAGNOSIS — Z79899 Other long term (current) drug therapy: Secondary | ICD-10-CM | POA: Diagnosis not present

## 2015-03-24 DIAGNOSIS — E780 Pure hypercholesterolemia: Secondary | ICD-10-CM | POA: Diagnosis present

## 2015-03-24 DIAGNOSIS — Z87891 Personal history of nicotine dependence: Secondary | ICD-10-CM

## 2015-03-24 DIAGNOSIS — I10 Essential (primary) hypertension: Secondary | ICD-10-CM | POA: Diagnosis present

## 2015-03-24 DIAGNOSIS — Z931 Gastrostomy status: Secondary | ICD-10-CM | POA: Diagnosis not present

## 2015-03-24 DIAGNOSIS — L89154 Pressure ulcer of sacral region, stage 4: Secondary | ICD-10-CM | POA: Diagnosis present

## 2015-03-24 DIAGNOSIS — F0390 Unspecified dementia without behavioral disturbance: Secondary | ICD-10-CM | POA: Diagnosis present

## 2015-03-24 HISTORY — PX: VENTRICULOPERITONEAL SHUNT: SHX204

## 2015-03-24 HISTORY — DX: Other cranial cerebrospinal fluid leak: G96.08

## 2015-03-24 HISTORY — DX: Benign neoplasm of cerebral meninges: D32.0

## 2015-03-24 HISTORY — DX: Cerebrospinal fluid leak: G96.0

## 2015-03-24 LAB — SURGICAL PCR SCREEN
MRSA, PCR: NEGATIVE
Staphylococcus aureus: POSITIVE — AB

## 2015-03-24 LAB — BASIC METABOLIC PANEL
Anion gap: 8 (ref 5–15)
BUN: 17 mg/dL (ref 6–20)
CALCIUM: 9.9 mg/dL (ref 8.9–10.3)
CHLORIDE: 108 mmol/L (ref 101–111)
CO2: 30 mmol/L (ref 22–32)
Creatinine, Ser: 0.64 mg/dL (ref 0.61–1.24)
GFR calc non Af Amer: >60 mL/min (ref >60)
Glucose, Bld: 106 mg/dL — ABNORMAL HIGH (ref 70–99)
Potassium: 3.8 mmol/L (ref 3.5–5.1)
Sodium: 146 mmol/L — ABNORMAL HIGH (ref 135–145)

## 2015-03-24 LAB — CBC
HCT: 37.6 % — ABNORMAL LOW (ref 39.0–52.0)
HEMOGLOBIN: 12.2 g/dL — AB (ref 13.0–17.0)
MCH: 29.7 pg (ref 26.0–34.0)
MCHC: 32.4 g/dL (ref 30.0–36.0)
MCV: 91.5 fL (ref 78.0–100.0)
PLATELETS: 398 10*3/uL (ref 150–400)
RBC: 4.11 MIL/uL — AB (ref 4.22–5.81)
RDW: 13.6 % (ref 11.5–15.5)
WBC: 10.6 10*3/uL — ABNORMAL HIGH (ref 4.0–10.5)

## 2015-03-24 LAB — GLUCOSE, CAPILLARY
Glucose-Capillary: 114 mg/dL — ABNORMAL HIGH (ref 70–99)
Glucose-Capillary: 95 mg/dL (ref 70–99)

## 2015-03-24 SURGERY — SHUNT INSERTION VENTRICULAR-PERITONEAL
Anesthesia: General | Site: Head

## 2015-03-24 MED ORDER — THROMBIN 5000 UNITS EX SOLR
CUTANEOUS | Status: DC | PRN
  Administered 2015-03-24 (×2): 5000 [IU] via TOPICAL

## 2015-03-24 MED ORDER — ACETAMINOPHEN-CODEINE #3 300-30 MG PO TABS: 1.0000 | ORAL_TABLET | Freq: Four times a day (QID) | ORAL | Status: DC | PRN

## 2015-03-24 MED ORDER — HEMOSTATIC AGENTS (NO CHARGE) OPTIME
TOPICAL | Status: DC | PRN
  Administered 2015-03-24: 1 via TOPICAL

## 2015-03-24 MED ORDER — SUCCINYLCHOLINE CHLORIDE 20 MG/ML IJ SOLN
INTRAMUSCULAR | Status: DC | PRN
  Administered 2015-03-24: 100 mg via INTRAVENOUS

## 2015-03-24 MED ORDER — JEVITY 1.5 CAL PO LIQD: 1000.0000 mL | ORAL | Status: DC

## 2015-03-24 MED ORDER — PROMETHAZINE HCL 25 MG PO TABS: 12.5000 mg | ORAL_TABLET | ORAL | Status: DC | PRN

## 2015-03-24 MED ORDER — LIDOCAINE HCL (CARDIAC) 20 MG/ML IV SOLN
INTRAVENOUS | Status: DC | PRN
  Administered 2015-03-24: 50 mg via INTRAVENOUS

## 2015-03-24 MED ORDER — RISAQUAD PO CAPS
1.0000 | ORAL_CAPSULE | Freq: Two times a day (BID) | ORAL | Status: DC
  Administered 2015-03-24 – 2015-03-31 (×14): 1 via ORAL
  Filled 2015-03-24 (×15): qty 1

## 2015-03-24 MED ORDER — PHENYLEPHRINE HCL 10 MG/ML IJ SOLN
10.0000 mg | INTRAVENOUS | Status: DC | PRN
  Administered 2015-03-24: 50 ug/min via INTRAVENOUS

## 2015-03-24 MED ORDER — ADULT MULTIVITAMIN LIQUID CH
5.0000 mL | Freq: Every day | ORAL | Status: DC
  Administered 2015-03-25 – 2015-03-31 (×7): 5 mL via ORAL
  Filled 2015-03-24 (×8): qty 5

## 2015-03-24 MED ORDER — GLYCOPYRROLATE 0.2 MG/ML IJ SOLN
INTRAMUSCULAR | Status: DC | PRN
  Administered 2015-03-24: .7 mg via INTRAVENOUS

## 2015-03-24 MED ORDER — AMLODIPINE BESYLATE 10 MG PO TABS
10.0000 mg | ORAL_TABLET | Freq: Every day | ORAL | Status: DC
  Administered 2015-03-25 – 2015-03-31 (×7): 10 mg
  Filled 2015-03-24 (×7): qty 1

## 2015-03-24 MED ORDER — NALOXONE HCL 0.4 MG/ML IJ SOLN: 0.0800 mg | INTRAMUSCULAR | Status: DC | PRN

## 2015-03-24 MED ORDER — CALCIUM CARBONATE 1250 (500 CA) MG PO TABS
1.0000 | ORAL_TABLET | Freq: Every day | ORAL | Status: DC
  Administered 2015-03-25 – 2015-03-31 (×7): 500 mg
  Filled 2015-03-24 (×8): qty 1

## 2015-03-24 MED ORDER — ONDANSETRON HCL 4 MG PO TABS: 4.0000 mg | ORAL_TABLET | ORAL | Status: DC | PRN

## 2015-03-24 MED ORDER — CERTAVITE/ANTIOXIDANTS PO LIQD: 10.0000 mL | Freq: Every day | ORAL | Status: DC

## 2015-03-24 MED ORDER — GLYCOPYRROLATE 0.2 MG/ML IJ SOLN
INTRAMUSCULAR | Status: AC
  Filled 2015-03-24: qty 4

## 2015-03-24 MED ORDER — JEVITY 1.5 CAL/FIBER PO LIQD
1000.0000 mL | ORAL | Status: DC
  Filled 2015-03-24: qty 1000

## 2015-03-24 MED ORDER — LIDOCAINE HCL (CARDIAC) 20 MG/ML IV SOLN
INTRAVENOUS | Status: AC
  Filled 2015-03-24: qty 10

## 2015-03-24 MED ORDER — HYDROCODONE-ACETAMINOPHEN 5-325 MG PO TABS: 1.0000 | ORAL_TABLET | ORAL | Status: DC | PRN

## 2015-03-24 MED ORDER — PHENYLEPHRINE 40 MCG/ML (10ML) SYRINGE FOR IV PUSH (FOR BLOOD PRESSURE SUPPORT)
PREFILLED_SYRINGE | INTRAVENOUS | Status: AC
  Filled 2015-03-24: qty 10

## 2015-03-24 MED ORDER — LIDOCAINE-EPINEPHRINE 0.5 %-1:200000 IJ SOLN
INTRAMUSCULAR | Status: DC | PRN
  Administered 2015-03-24: 3 mL via INTRADERMAL

## 2015-03-24 MED ORDER — JEVITY 1.5 CAL/FIBER PO LIQD
1000.0000 mL | ORAL | Status: DC
  Administered 2015-03-25 – 2015-03-30 (×6): 1000 mL
  Filled 2015-03-24 (×10): qty 1000

## 2015-03-24 MED ORDER — CEFAZOLIN SODIUM 1-5 GM-% IV SOLN
1.0000 g | Freq: Three times a day (TID) | INTRAVENOUS | Status: AC
  Administered 2015-03-24 – 2015-03-25 (×2): 1 g via INTRAVENOUS
  Filled 2015-03-24 (×2): qty 50

## 2015-03-24 MED ORDER — JEVITY 1.5 CAL/FIBER PO LIQD
1000.0000 mL | ORAL | Status: AC
  Administered 2015-03-24: 1000 mL
  Filled 2015-03-24: qty 1000

## 2015-03-24 MED ORDER — MICROFIBRILLAR COLL HEMOSTAT EX PADS
MEDICATED_PAD | CUTANEOUS | Status: DC | PRN
  Administered 2015-03-24: 1 via TOPICAL

## 2015-03-24 MED ORDER — FENTANYL CITRATE (PF) 100 MCG/2ML IJ SOLN
INTRAMUSCULAR | Status: DC | PRN
  Administered 2015-03-24: 50 ug via INTRAVENOUS

## 2015-03-24 MED ORDER — LEVETIRACETAM 100 MG/ML PO SOLN
500.0000 mg | Freq: Two times a day (BID) | ORAL | Status: DC
  Administered 2015-03-24 – 2015-03-31 (×14): 500 mg
  Filled 2015-03-24 (×17): qty 5

## 2015-03-24 MED ORDER — PRAVASTATIN SODIUM 20 MG PO TABS
20.0000 mg | ORAL_TABLET | Freq: Every day | ORAL | Status: DC
  Administered 2015-03-25 – 2015-03-31 (×7): 20 mg
  Filled 2015-03-24 (×7): qty 1

## 2015-03-24 MED ORDER — STERILE WATER FOR INJECTION IJ SOLN
INTRAMUSCULAR | Status: AC
Start: 2015-03-24 — End: 2015-03-24
  Filled 2015-03-24: qty 10

## 2015-03-24 MED ORDER — SODIUM CHLORIDE 0.9 % IV SOLN
INTRAVENOUS | Status: DC | PRN
  Administered 2015-03-24: 15:00:00 via INTRAVENOUS

## 2015-03-24 MED ORDER — SUCCINYLCHOLINE CHLORIDE 20 MG/ML IJ SOLN
INTRAMUSCULAR | Status: AC
  Filled 2015-03-24: qty 1

## 2015-03-24 MED ORDER — VITAMIN C 500 MG PO TABS
1000.0000 mg | ORAL_TABLET | Freq: Every day | ORAL | Status: DC
  Administered 2015-03-25 – 2015-03-31 (×7): 1000 mg
  Filled 2015-03-24 (×7): qty 2

## 2015-03-24 MED ORDER — LISINOPRIL 20 MG PO TABS
40.0000 mg | ORAL_TABLET | Freq: Every day | ORAL | Status: DC
  Administered 2015-03-25 – 2015-03-31 (×7): 40 mg
  Filled 2015-03-24 (×3): qty 2
  Filled 2015-03-24 (×2): qty 1
  Filled 2015-03-24: qty 4
  Filled 2015-03-24: qty 2

## 2015-03-24 MED ORDER — SENNA 8.6 MG PO TABS
2.0000 | ORAL_TABLET | Freq: Two times a day (BID) | ORAL | Status: DC
  Administered 2015-03-24 – 2015-03-30 (×10): 17.2 mg
  Filled 2015-03-24 (×12): qty 2

## 2015-03-24 MED ORDER — 0.9 % SODIUM CHLORIDE (POUR BTL) OPTIME
TOPICAL | Status: DC | PRN
  Administered 2015-03-24: 1000 mL

## 2015-03-24 MED ORDER — DOCUSATE SODIUM 50 MG/5ML PO LIQD
100.0000 mg | Freq: Two times a day (BID) | ORAL | Status: DC
  Administered 2015-03-24 – 2015-03-30 (×10): 100 mg
  Filled 2015-03-24 (×12): qty 10

## 2015-03-24 MED ORDER — ALBUMIN HUMAN 5 % IV SOLN
INTRAVENOUS | Status: DC | PRN
  Administered 2015-03-24 (×2): via INTRAVENOUS

## 2015-03-24 MED ORDER — ONDANSETRON HCL 4 MG/2ML IJ SOLN: 4.0000 mg | INTRAMUSCULAR | Status: DC | PRN

## 2015-03-24 MED ORDER — EPHEDRINE SULFATE 50 MG/ML IJ SOLN
INTRAMUSCULAR | Status: DC | PRN
  Administered 2015-03-24 (×2): 10 mg via INTRAVENOUS

## 2015-03-24 MED ORDER — LABETALOL HCL 5 MG/ML IV SOLN: 10.0000 mg | INTRAVENOUS | Status: DC | PRN

## 2015-03-24 MED ORDER — VECURONIUM BROMIDE 10 MG IV SOLR
INTRAVENOUS | Status: AC
  Filled 2015-03-24: qty 20

## 2015-03-24 MED ORDER — CHLORHEXIDINE GLUCONATE 0.12 % MT SOLN
15.0000 mL | Freq: Three times a day (TID) | OROMUCOSAL | Status: DC
  Administered 2015-03-24 – 2015-03-31 (×19): 15 mL via OROMUCOSAL
  Filled 2015-03-24 (×18): qty 15

## 2015-03-24 MED ORDER — PRO-STAT SUGAR FREE PO LIQD
30.0000 mL | Freq: Two times a day (BID) | ORAL | Status: DC
  Administered 2015-03-24 – 2015-03-31 (×13): 30 mL via ORAL
  Filled 2015-03-24 (×23): qty 30

## 2015-03-24 MED ORDER — VECURONIUM BROMIDE 10 MG IV SOLR
INTRAVENOUS | Status: DC | PRN
  Administered 2015-03-24: 4 mg via INTRAVENOUS

## 2015-03-24 MED ORDER — FENTANYL CITRATE (PF) 250 MCG/5ML IJ SOLN
INTRAMUSCULAR | Status: AC
  Filled 2015-03-24: qty 5

## 2015-03-24 MED ORDER — PHENYLEPHRINE HCL 10 MG/ML IJ SOLN
INTRAMUSCULAR | Status: DC | PRN
  Administered 2015-03-24 (×3): 80 ug via INTRAVENOUS

## 2015-03-24 MED ORDER — POTASSIUM CHLORIDE IN NACL 20-0.9 MEQ/L-% IV SOLN
INTRAVENOUS | Status: DC
  Administered 2015-03-24 – 2015-03-28 (×8): via INTRAVENOUS
  Administered 2015-03-29: 80 mL/h via INTRAVENOUS
  Administered 2015-03-29: 08:00:00 via INTRAVENOUS
  Administered 2015-03-30: 80 mL/h via INTRAVENOUS
  Administered 2015-03-30: 11:00:00 via INTRAVENOUS
  Filled 2015-03-24 (×16): qty 1000

## 2015-03-24 MED ORDER — MUPIROCIN 2 % EX OINT
TOPICAL_OINTMENT | CUTANEOUS | Status: AC
  Administered 2015-03-24: 1 via TOPICAL
  Filled 2015-03-24: qty 22

## 2015-03-24 MED ORDER — PROSOURCE PO LIQD: 30.0000 mL | Freq: Two times a day (BID) | ORAL | Status: DC

## 2015-03-24 MED ORDER — DIPHENHYDRAMINE HCL 12.5 MG/5ML PO LIQD
25.0000 mg | Freq: Four times a day (QID) | ORAL | Status: DC | PRN
  Filled 2015-03-24: qty 10

## 2015-03-24 MED ORDER — PROPOFOL 10 MG/ML IV BOLUS
INTRAVENOUS | Status: DC | PRN
  Administered 2015-03-24: 90 mg via INTRAVENOUS

## 2015-03-24 MED ORDER — MUPIROCIN 2 % EX OINT
1.0000 "application " | TOPICAL_OINTMENT | Freq: Once | CUTANEOUS | Status: AC
  Administered 2015-03-24: 1 via TOPICAL

## 2015-03-24 MED ORDER — LACTATED RINGERS IV SOLN: INTRAVENOUS | Status: DC

## 2015-03-24 MED ORDER — MORPHINE SULFATE 2 MG/ML IJ SOLN
1.0000 mg | INTRAMUSCULAR | Status: DC | PRN
  Administered 2015-03-27: 2 mg via INTRAVENOUS
  Filled 2015-03-24: qty 1

## 2015-03-24 MED ORDER — NEOSTIGMINE METHYLSULFATE 10 MG/10ML IV SOLN
INTRAVENOUS | Status: DC | PRN
  Administered 2015-03-24: 4 mg via INTRAVENOUS

## 2015-03-24 MED ORDER — FERROUS SULFATE 220 (44 FE) MG/5ML PO ELIX
330.0000 mg | ORAL_SOLUTION | Freq: Every day | ORAL | Status: DC
  Administered 2015-03-25 – 2015-03-31 (×7): 330 mg
  Filled 2015-03-24 (×7): qty 7.5

## 2015-03-24 MED ORDER — EPHEDRINE SULFATE 50 MG/ML IJ SOLN
INTRAMUSCULAR | Status: AC
  Filled 2015-03-24: qty 1

## 2015-03-24 MED ORDER — SODIUM CHLORIDE 0.9 % IJ SOLN
INTRAMUSCULAR | Status: AC
  Filled 2015-03-24: qty 10

## 2015-03-24 SURGICAL SUPPLY — 69 items
BLADE 10 SAFETY STRL DISP (BLADE) ×3 IMPLANT
BLADE CLIPPER SURG (BLADE) ×6 IMPLANT
BLADE SURG 11 STRL SS (BLADE) ×3 IMPLANT
BOOT SUTURE AID YELLOW STND (SUTURE) ×3 IMPLANT
BRUSH SCRUB EZ 1% IODOPHOR (MISCELLANEOUS) ×6 IMPLANT
BUR ACORN 6.0 PRECISION (BURR) ×2 IMPLANT
BUR ACORN 6.0MM PRECISION (BURR) ×1
CANISTER SUCT 3000ML PPV (MISCELLANEOUS) ×3 IMPLANT
CLIP RANEY DISP (INSTRUMENTS) IMPLANT
CLOSURE WOUND 1/4X4 (GAUZE/BANDAGES/DRESSINGS)
CONT SPEC 4OZ CLIKSEAL STRL BL (MISCELLANEOUS) IMPLANT
COVER BACK TABLE 60X90IN (DRAPES) ×6 IMPLANT
DECANTER SPIKE VIAL GLASS SM (MISCELLANEOUS) ×3 IMPLANT
DERMABOND ADVANCED (GAUZE/BANDAGES/DRESSINGS) ×4
DERMABOND ADVANCED .7 DNX12 (GAUZE/BANDAGES/DRESSINGS) ×2 IMPLANT
DRAPE INCISE IOBAN 85X60 (DRAPES) ×3 IMPLANT
DRAPE ORTHO SPLIT 77X108 STRL (DRAPES) ×4
DRAPE POUCH INSTRU U-SHP 10X18 (DRAPES) ×3 IMPLANT
DRAPE SURG ORHT 6 SPLT 77X108 (DRAPES) ×2 IMPLANT
DRSG OPSITE 4X5.5 SM (GAUZE/BANDAGES/DRESSINGS) ×6 IMPLANT
DRSG OPSITE POSTOP 3X4 (GAUZE/BANDAGES/DRESSINGS) ×3 IMPLANT
DRSG TELFA 3X8 NADH (GAUZE/BANDAGES/DRESSINGS) ×3 IMPLANT
DURAPREP 26ML APPLICATOR (WOUND CARE) ×6 IMPLANT
ELECT REM PT RETURN 9FT ADLT (ELECTROSURGICAL) ×3
ELECTRODE REM PT RTRN 9FT ADLT (ELECTROSURGICAL) ×1 IMPLANT
GAUZE SPONGE 4X4 16PLY XRAY LF (GAUZE/BANDAGES/DRESSINGS) IMPLANT
GLOVE BIO SURGEON STRL SZ 6.5 (GLOVE) ×4 IMPLANT
GLOVE BIO SURGEON STRL SZ8.5 (GLOVE) ×3 IMPLANT
GLOVE BIO SURGEONS STRL SZ 6.5 (GLOVE) ×2
GLOVE BIOGEL PI IND STRL 6.5 (GLOVE) ×1 IMPLANT
GLOVE BIOGEL PI INDICATOR 6.5 (GLOVE) ×2
GLOVE ECLIPSE 6.5 STRL STRAW (GLOVE) ×3 IMPLANT
GLOVE EXAM NITRILE LRG STRL (GLOVE) IMPLANT
GLOVE EXAM NITRILE MD LF STRL (GLOVE) IMPLANT
GLOVE EXAM NITRILE XL STR (GLOVE) IMPLANT
GLOVE EXAM NITRILE XS STR PU (GLOVE) IMPLANT
GLOVE SS BIOGEL STRL SZ 8 (GLOVE) ×1 IMPLANT
GLOVE SUPERSENSE BIOGEL SZ 8 (GLOVE) ×2
GOWN STRL REUS W/ TWL LRG LVL3 (GOWN DISPOSABLE) ×2 IMPLANT
GOWN STRL REUS W/ TWL XL LVL3 (GOWN DISPOSABLE) IMPLANT
GOWN STRL REUS W/TWL 2XL LVL3 (GOWN DISPOSABLE) IMPLANT
GOWN STRL REUS W/TWL LRG LVL3 (GOWN DISPOSABLE) ×4
GOWN STRL REUS W/TWL XL LVL3 (GOWN DISPOSABLE)
HEMOSTAT SURGICEL 2X14 (HEMOSTASIS) IMPLANT
KIT BASIN OR (CUSTOM PROCEDURE TRAY) ×3 IMPLANT
KIT ROOM TURNOVER OR (KITS) ×3 IMPLANT
MARKER SKIN DUAL TIP RULER LAB (MISCELLANEOUS) ×3 IMPLANT
NEEDLE HYPO 25X1 1.5 SAFETY (NEEDLE) ×3 IMPLANT
NS IRRIG 1000ML POUR BTL (IV SOLUTION) ×3 IMPLANT
PACK LAMINECTOMY NEURO (CUSTOM PROCEDURE TRAY) ×3 IMPLANT
PAD ARMBOARD 7.5X6 YLW CONV (MISCELLANEOUS) ×9 IMPLANT
SPONGE LAP 4X18 X RAY DECT (DISPOSABLE) IMPLANT
SPONGE SURGIFOAM ABS GEL 12-7 (HEMOSTASIS) IMPLANT
STAPLER SKIN PROX WIDE 3.9 (STAPLE) ×3 IMPLANT
STAPLER VISISTAT 35W (STAPLE) ×3 IMPLANT
STRIP CLOSURE SKIN 1/4X4 (GAUZE/BANDAGES/DRESSINGS) IMPLANT
SUT BONE WAX W31G (SUTURE) ×3 IMPLANT
SUT ETHILON 3 0 PS 1 (SUTURE) ×3 IMPLANT
SUT NURALON 4 0 TR CR/8 (SUTURE) IMPLANT
SUT SILK 0 TIES 10X30 (SUTURE) ×3 IMPLANT
SUT SILK 3 0 SH 30 (SUTURE) IMPLANT
SUT VIC AB 2-0 CT2 18 VCP726D (SUTURE) ×9 IMPLANT
SUT VIC AB 3-0 SH 8-18 (SUTURE) ×3 IMPLANT
SYR CONTROL 10ML LL (SYRINGE) ×3 IMPLANT
TOWEL OR 17X24 6PK STRL BLUE (TOWEL DISPOSABLE) ×3 IMPLANT
TOWEL OR 17X26 10 PK STRL BLUE (TOWEL DISPOSABLE) ×3 IMPLANT
UNDERPAD 30X30 INCONTINENT (UNDERPADS AND DIAPERS) ×3 IMPLANT
VALVE RT ANGLE UNITIZE DIST (Valve) ×3 IMPLANT
WATER STERILE IRR 1000ML POUR (IV SOLUTION) ×3 IMPLANT

## 2015-03-24 NOTE — Op Note (Signed)
03/24/2015  6:08 PM  PATIENT:  William [name]  81 y.o. male with hydrocephalus  PRE-OPERATIVE DIAGNOSIS:  benign neoplasm of cerebral meninges, hydrocephalus  POST-OPERATIVE DIAGNOSIS:  benign neoplasm of cerebral meninges, hydrocephalus  PROCEDURE:  Left Frontal ventriculoperitoneal shunt creation Codman-Hakim programmable valve set at 112mmH2O  SURGEON:  Surgeon(s): Ashok Pall, MD Newman Pies, MD  ASSISTANTS:Jenkins, Dellis Filbert  ANESTHESIA:   General  EBL:  Total I/O In: 500 [IV Piggyback:500] Out: -   COUNT:per nursing  SPECIMEN:  No Specimen  DICTATION: [name] was brought to the operating room intubated and placed under a general anesthetic without difficulty. He was positioned supine on the operating room table. His head was shaved and prepped in a sterile manner. The neck and abdomen was also prepped and draped in a sterile manner. I infiltrated lidocaine into the abdomen, in the right upper quadrant just off the midline. I also infiltrated lidocaine into the planned scalp incision.  I opened the abdomen with a 10 blade and dissected through the subcutaneous tissue to the anterior rectus sheath. I opened the anterior rectus sheath with Metzenbaum scissors. I then bluntly divided the rectus muscle and retracted it with vicryl sutures, exposing the posterior rectus sheath. I opened the posterior rectus sheath with Metzenbaum scissors just enough to expose the peritoneum. I pulled the peritoneum up with straight snaps and secured and released it a few times to make sure no bowel would be harmed. I opened the peritoneum with the scissors and secured the edges with the snaps.  I then tunneled from the abdominal incision to the clavicle, then the post auricular region. I opened the skin with a scalpel over the tunneler then brought the tunneler through the incision. I passed a silk tie from the post auricular incision to the abdominal incisioin, securing the ends with hemostats. I then  opened the scalp incision with a 10 blade. I created a burr hole with the drill and acorn attachment. We tunneled from the scalp incision to the post auricular incision using another silk tie. I brought the shunt into the operative field and using the ties pulled the distal end of the catheter out of the abdominal incision.  I opened the dura and placed the ventricular catheter into the lateral ventricle. We then cut the catheter and secured it to the integrated shunt. I observed good flow from the distal end of the catheter. At this time the distal catheter was placed into the peritoneum, and the abdominal incision was closed in layers using vicryl sutures. Dermabond was used for a sterile dressing. The cranial incisions were then closed with galeal sutures, and the scalp with staples. Sterile dressings were applied.   PLAN OF CARE: Admit to inpatient   PATIENT DISPOSITION:  PACU - hemodynamically stable.   Delay start of Pharmacological VTE agent (>24hrs) due to surgical blood loss or risk of bleeding:  yes

## 2015-03-24 NOTE — Anesthesia Preprocedure Evaluation (Addendum)
Anesthesia Evaluation  Patient identified by MRN, date of birth, ID band Patient unresponsive    Reviewed: Allergy & Precautions, NPO status , Patient's Chart, lab work & pertinent test results  Airway Mallampati: II       Dental   Pulmonary former smoker,    Pulmonary exam normal       Cardiovascular hypertension, Rhythm:Regular Rate:Normal     Neuro/Psych    GI/Hepatic   Endo/Other  diabetes, Type 2  Renal/GU      Musculoskeletal  (+) Arthritis -,   Abdominal   Peds  Hematology   Anesthesia Other Findings Dementia  Reproductive/Obstetrics                            Anesthesia Physical Anesthesia Plan  ASA: III  Anesthesia Plan: General   Post-op Pain Management:    Induction: Intravenous  Airway Management Planned: Oral ETT  Additional Equipment:   Intra-op Plan:   Post-operative Plan: Extubation in OR  Informed Consent: I have reviewed the patients History and Physical, chart, labs and discussed the procedure including the risks, benefits and alternatives for the proposed anesthesia with the patient or authorized representative who has indicated his/her understanding and acceptance.     Plan Discussed with: CRNA, Anesthesiologist and Surgeon  Anesthesia Plan Comments:         Anesthesia Quick Evaluation

## 2015-03-24 NOTE — H&P (Addendum)
William [name] is an 81 y.o. male.   Chief Complaint: hydrocephalus, meningioma HPI: whom is well known to me. He presents today for placement of a left frontal VP shunt.  Past Medical History  Diagnosis Date  . High cholesterol   . Meningioma     recurrent; involving right sphenoid wing  . Diabetes mellitus without complication   . Type 2 diabetes mellitus   . Hypertension     does not see a cardiologist  . Dementia   . Subdural hygroma   . Benign neoplasm of cerebral meninges     Past Surgical History  Procedure Laterality Date  . Craniotomy  08/31/2012    Procedure: CRANIOTOMY TUMOR EXCISION;  Surgeon: Winfield Cunas, MD;  Location: [place] [facility];  Service: Neurosurgery;  Laterality: Right;  RIGHT Craniotomy for tumor with cusa  . Appendectomy    . Craniotomy Right 08/04/2013    Procedure: CRANIOTOMY TUMOR EXCISION;  Surgeon: Winfield Cunas, MD;  Location: [place] [facility];  Service: Neurosurgery;  Laterality: Right;  RIGHT temporal Craniotomy for tumor resection  . Craniotomy Right 09/02/2014    Procedure: RIGHT CRANIOTOMY FOR TUMOR EXCISION;  Surgeon: Ashok Pall, MD;  Location: [place] [facility];  Service: Neurosurgery;  Laterality: Right;  . Craniotomy Right 11/26/2014    Procedure: craniotomy for evacuation of hygroma, removal of reurrent middle fossa meninioma;  Surgeon: Ashok Pall, MD;  Location: [place] [facility];  Service: Neurosurgery;  Laterality: Right;  craniotomy for evacuation of hygroma, removal of reurrent middle fossa meninioma  . Trudee Kuster hole Right 12/07/2014    Procedure: BURR HOLES for Subdural Hygroma;  Surgeon: Ashok Pall, MD;  Location: [place] [facility];  Service: Neurosurgery;  Laterality: Right;  . Gastrostomy tube placement      Family History  Problem Relation Age of Onset  . Cancer Neg Hx    Social History:  reports that he quit smoking about 50 years ago. He has never used smokeless tobacco. He reports that he does not drink alcohol or use illicit drugs.  Allergies: No  Known Allergies  Medications Prior to Admission  Medication Sig Dispense Refill  . acetaminophen-codeine (TYLENOL #3) 300-30 MG per tablet Take 1 tablet by mouth every 6 (six) hours as needed for moderate pain. (Patient taking differently: 1 tablet by PEG Tube route every 6 (six) hours as needed for moderate pain. ) 40 tablet 0  . acidophilus (RISAQUAD) CAPS capsule 1 capsule by PEG Tube route 2 (two) times daily.     Marland Kitchen amLODipine (NORVASC) 10 MG tablet 10 mg by PEG Tube route daily.     . calcium carbonate (OS-CAL - DOSED IN MG OF ELEMENTAL CALCIUM) 1250 MG tablet 1 tablet by PEG Tube route daily.     . chlorhexidine (PERIDEX) 0.12 % solution Use as directed 15 mLs in the mouth or throat 3 (three) times daily.    . diphenhydrAMINE (BENADRYL) 12.5 MG/5ML liquid 25 mg by PEG Tube route every 6 (six) hours as needed for itching.     . docusate (COLACE) 50 MG/5ML liquid 100 mg by PEG Tube route 2 (two) times daily.     . ferrous sulfate 220 (44 FE) MG/5ML solution 330 mg by PEG Tube route daily.    Marland Kitchen levETIRAcetam (KEPPRA) 100 MG/ML solution 500 mg by PEG Tube route 2 (two) times daily. 9am, 9pm    . lisinopril (PRINIVIL,ZESTRIL) 40 MG tablet 40 mg by PEG Tube route daily.     . Multiple Vitamins-Minerals (  CERTAVITE/ANTIOXIDANTS) LIQD 10 mLs by PEG Tube route daily.     . Nutritional Supplements (FEEDING SUPPLEMENT, JEVITY 1.5 CAL,) LIQD by PEG Tube route See admin instructions. 60cc/hour for 18 hours - start 2pm, off 8am    . pravastatin (PRAVACHOL) 20 MG tablet 20 mg by PEG Tube route daily.     . Protein (PROSOURCE PO) 30 mLs by PEG Tube route 2 (two) times daily.     Marland Kitchen senna (SENOKOT) 8.6 MG TABS tablet 2 tablets by PEG Tube route 2 (two) times daily.     . vitamin C (ASCORBIC ACID) 500 MG tablet 1,000 mg by PEG Tube route daily.     . feeding supplement, RESOURCE BREEZE, (RESOURCE BREEZE) LIQD Take 1 Container by mouth 2 (two) times daily between meals. (Patient not taking: Reported on  03/24/2015) 574 Container 0    Results for orders placed or performed during the hospital encounter of 03/24/15 (from the past 48 hour(s))  Glucose, capillary     Status: Abnormal   Collection Time: 03/24/15  1:02 PM  Result Value Ref Range   Glucose-Capillary 114 (H) 70 - 99 mg/dL   No results found.  Review of Systems  Unable to perform ROS: mental acuity  Skin: Negative.   Neurological: Positive for speech change, focal weakness and weakness.       Purposeful in right upper extremity, did shake my hand perrl full eom   Psychiatric/Behavioral:       Unknown    Blood pressure 120/99, pulse 95, temperature 97.8 F (36.6 C), temperature source Oral, resp. rate 18, SpO2 98 %. Physical Exam  Constitutional:  Mildly cachetic  HENT:  Well healed cranial incisions  Eyes: Conjunctivae and EOM are normal. Pupils are equal, round, and reactive to light.  Cardiovascular: Normal rate, regular rhythm and normal heart sounds.   Respiratory: Effort normal and breath sounds normal.  GI: Soft.  Feeding tube  Neurological: He is alert. No cranial nerve deficit.  Unable to fully assess cranial nerves. Moving upper extremities right greater than left Will follow some commands Non verbal Full eom perrl  Skin: Skin is warm and dry.   sacral decubitus  Assessment/Plan Left VP shunt. We are doing this in the hope that it will improve his neurological exam. Risks including stroke, coma, death, no improvement, bleeding, infection, and other risks were discussed with his family who wish to proceed.  Patient has sacral decubitus which will need wound care during this admission Canon Gola L 03/24/2015, 2:28 PM

## 2015-03-24 NOTE — Transfer of Care (Signed)
Immediate Anesthesia Transfer of Care Note  Patient: William [name]  Procedure(s) Performed: Procedure(s): SHUNT INSERTION VENTRICULAR-PERITONEAL (N/A)  Patient Location: PACU  Anesthesia Type:General  Level of Consciousness: awake and alert   Airway & Oxygen Therapy: Patient Spontanous Breathing and Patient connected to face mask oxygen  Post-op Assessment: Report given to RN and Post -op Vital signs reviewed and stable  Post vital signs: Reviewed and stable  Last Vitals:  Filed Vitals:   03/24/15 1823  BP:   Pulse:   Temp: 36.4 C  Resp:     Complications: No apparent anesthesia complications

## 2015-03-24 NOTE — Progress Notes (Signed)
eLink Physician-Brief Progress Note Patient Name: MARTHA SOLTYS DOB: 08-07-1934 MRN: [id number]   Date of Service  03/24/2015  HPI/Events of Note  Left Frontal ventriculoperitoneal shunt creation  eICU Interventions  No additional elink interventions at this time.     Intervention Category Major Interventions: Other:  Naya Ilagan 03/24/2015, 8:47 PM

## 2015-03-24 NOTE — Progress Notes (Signed)
Unable to obtain IV, Neuro OR ready for pt, will insert IV once transported to 3rd floor.

## 2015-03-24 NOTE — Anesthesia Procedure Notes (Signed)
Procedure Name: Intubation Date/Time: 03/24/2015 3:31 PM Performed by: Eligha Bridegroom Pre-anesthesia Checklist: Emergency Drugs available, Patient identified, Timeout performed, Suction available and Patient being monitored Patient Re-evaluated:Patient Re-evaluated prior to inductionOxygen Delivery Method: Circle system utilized Preoxygenation: Pre-oxygenation with 100% oxygen Intubation Type: IV induction Laryngoscope Size: Mac and 4 Grade View: Grade I Tube type: Oral Tube size: 7.5 mm Number of attempts: 1 Airway Equipment and Method: Stylet Secured at: 21 cm Tube secured with: Tape

## 2015-03-25 NOTE — Progress Notes (Signed)
Initial Nutrition Assessment  DOCUMENTATION CODES:  Non-severe (moderate) malnutrition in context of chronic illness  INTERVENTION:  Tube feeding, Prostat (Recommend Jevity 1.2 @ 75 ml/hr x 18 hours (home regimen Jevity 1.5 @ 60 ml/hr x 18 hours))  NUTRITION DIAGNOSIS:  Inadequate oral intake related to inability to eat as evidenced by NPO status.   GOAL:  Patient will meet greater than or equal to 90% of their needs   MONITOR:  TF tolerance, Labs, Weight trends, I & O's, Skin  REASON FOR ASSESSMENT:  Other (Comment) (Home TF)    ASSESSMENT: Pt is a 81 year old male admitted with hydrocephalus and meningioma. He presents today for placement of a left frontal VP shunt.  S/p PROCEDURE on 03/24/15:  Left Frontal ventriculoperitoneal shunt creation Codman-Hakim programmable valve set at 149mmH2O  Pt unable to provide hx. Per previous RD notes, pt with long hx of dysphagia and poor po intake. He now receives 100% of nutrition via TF.  Chart review indicates progressive wt loss over the past 6 months. Noted a 16# (11%) wt loss x 6 months per wt records. UBW: 145.  Paged by pharmacy last night regarding TF regimen. PTA regimen is Jevity 1.5 @ 60 ml/hr x 18 hours with 30 ml Prostat BID. This regimen provides 1820 kcals, 99 grams of protein, and 821 ml fluid daily, which meets 100% of estimated needs.  Noted orders for home regimen. Recommend change to Jevity 1.2 @ 70 ml/hr x 18 hours with 30 ml Prostat BID, which provides 1820 kcals, 104 grams protein, and 1089 ml of fluid daily (meets 100% of estimated kcal and protein needs) due to Jevity 1.5 not being on formulary.  TF currently off at time of visit, however, expected to start at 1400 per orders.  Labs reviewed. Na: 146, Glucose: 106.  Height:  Ht Readings from Last 1 Encounters:  03/24/15 5\' 2"  (1.575 m)    Weight:  Wt Readings from Last 1 Encounters:  03/25/15 125 lb 7.1 oz (56.9 kg)    Ideal Body Weight:  53.6  kg  Wt Readings from Last 10 Encounters:  03/25/15 125 lb 7.1 oz (56.9 kg)  12/23/14 127 lb 3.3 oz (57.7 kg)  11/08/14 143 lb (64.864 kg)  09/02/14 141 lb 12.1 oz (64.3 kg)  01/03/14 145 lb 9.6 oz (66.044 kg)  08/04/13 149 lb 0.5 oz (67.6 kg)  08/31/12 139 lb 8.8 oz (63.3 kg)    BMI:  Body mass index is 22.94 kg/(m^2). Normal weight range  Estimated Nutritional Needs:  Kcal:  1800-2000  Protein:  90-100 grams  Fluid:  1.8-2.0 L  Skin:  Wound (see comment) (open rt thigh incision, unstageable sacral wound,  closed head incision)  Diet Order:   NPO  EDUCATION NEEDS:  No education needs identified at this time   Intake/Output Summary (Last 24 hours) at 03/25/15 1533 Last data filed at 03/25/15 1503  Gross per 24 hour  Intake 2991.33 ml  Output   1535 ml  Net 1456.33 ml    Last BM:  03/24/15  Delsin Copen A. Jimmye Norman, RD, LDN, CDE Pager: [phone] After hours Pager: [phone]

## 2015-03-25 NOTE — Progress Notes (Signed)
Patient ID: William [name], male   DOB: [birth year], 81 y.o.   MRN: [id number] Subjective:  The patient is alert and attentive. He is in no apparent distress. There is a language barrier  Objective: Vital signs in last 24 hours: Temp:  [97.6 F (36.4 C)-98.8 F (37.1 C)] 98.4 F (36.9 C) (05/07 0800) Pulse Rate:  [58-95] 74 (05/07 0800) Resp:  [12-26] 15 (05/07 0800) BP: (88-144)/(44-99) 113/52 mmHg (05/07 0800) SpO2:  [98 %-100 %] 99 % (05/07 0800) Weight:  [54 kg (119 lb 0.8 oz)-57.607 kg (127 lb)] 56.9 kg (125 lb 7.1 oz) (05/07 0438)  Intake/Output from previous day: 05/06 0701 - 05/07 0700 In: 1861.3 [I.V.:891.3; IV Piggyback:550] Out: 600 [Urine:600] Intake/Output this shift: Total I/O In: 390 [I.V.:160; Other:180; IV Piggyback:50] Out: 250 [Urine:250]  Physical exam the patient is alert. His dressing is clean and dry.  Lab Results:  Recent Labs  03/24/15 1432  WBC 10.6*  HGB 12.2*  HCT 37.6*  PLT 398   BMET  Recent Labs  03/24/15 1432  NA 146*  K 3.8  CL 108  CO2 30  GLUCOSE 106*  BUN 17  CREATININE 0.64  CALCIUM 9.9    Studies/Results: No results found.  Assessment/Plan: Postop day #1: The best I can tell the patient is at his baseline.  LOS: 1 day     William [name] D 03/25/2015, 8:43 AM

## 2015-03-26 DIAGNOSIS — E440 Moderate protein-calorie malnutrition: Secondary | ICD-10-CM | POA: Insufficient documentation

## 2015-03-26 LAB — GLUCOSE, CAPILLARY: Glucose-Capillary: 138 mg/dL — ABNORMAL HIGH (ref 70–99)

## 2015-03-26 NOTE — Progress Notes (Signed)
Patient ID: William [name], male   DOB: 27-Dec-1933, 81 y.o.   MRN: [id number] Subjective:  The patient is alert and attentive. There is a language barrier. He is in no apparent distress.  Objective: Vital signs in last 24 hours: Temp:  [98.3 F (36.8 C)-99.3 F (37.4 C)] 98.3 F (36.8 C) (05/08 0426) Pulse Rate:  [73-91] 91 (05/08 0700) Resp:  [14-23] 20 (05/08 0700) BP: (87-137)/(45-84) 123/59 mmHg (05/08 0700) SpO2:  [98 %-100 %] 99 % (05/08 0700)  Intake/Output from previous day: 05/07 0701 - 05/08 0700 In: 3370 [I.V.:2000; NG/GT:1020; IV Piggyback:50] Out: 2865 [Urine:2865] Intake/Output this shift:    Physical exam the patient is alert and attentive. He is moving all 4 extremities. His scalp incision dressing has some blood staining.  Lab Results:  Recent Labs  03/24/15 1432  WBC 10.6*  HGB 12.2*  HCT 37.6*  PLT 398   BMET  Recent Labs  03/24/15 1432  NA 146*  K 3.8  CL 108  CO2 30  GLUCOSE 106*  BUN 17  CREATININE 0.64  CALCIUM 9.9    Studies/Results: No results found.  Assessment/Plan: Postop day #2: I will transfer the patient to the floor.  LOS: 2 days     [name] D 03/26/2015, 7:25 AM

## 2015-03-27 ENCOUNTER — Inpatient Hospital Stay (HOSPITAL_COMMUNITY): Payer: Medicare Other

## 2015-03-27 ENCOUNTER — Encounter (HOSPITAL_COMMUNITY): Payer: Self-pay | Admitting: Neurosurgery

## 2015-03-27 NOTE — Progress Notes (Signed)
Nutrition Follow-up  DOCUMENTATION CODES:  Non-severe (moderate) malnutrition in context of chronic illness  INTERVENTION:  Tube feeding, Prostat (Jevity 1.5_0 /hr for 18 hours daily with 30 ml of Pro-stat BID)  NUTRITION DIAGNOSIS:  Inadequate oral intake related to inability to eat as evidenced by NPO status.  Ongoing  GOAL:  Patient will meet greater than or equal to 90% of their needs  Being Met  MONITOR:  TF tolerance, Labs, Weight trends, I & O's, Skin  REASON FOR ASSESSMENT:  Other (Comment) (Home TF)    ASSESSMENT: 81 year old male admitted with hydrocephalus and meningioma. He presents for placement of a left frontal VP shunt. S/p Left Frontal ventriculoperitoneal shunt creation on 5/6  Pt is receiving Jevity 1.5@ 60 ml/hr for 18 hours daily with 30 ml of Pro-Stat BID. This provides 1820 kcal, 99 grams of protein, and 821 ml of water. Pt getting 30 ml free water flushes q 4 hours for an additional 150 ml daily. IV fluids running at 80 ml/hr at time of visit.   Height:  Ht Readings from Last 1 Encounters:  03/26/15 _1  (1.575 m)    Weight:  Wt Readings from Last 1 Encounters:  03/26/15 119 lb 11.4 oz (54.3 kg)    Ideal Body Weight:  53.6 kg  Wt Readings from Last 10 Encounters:  03/26/15 119 lb 11.4 oz (54.3 kg)  12/23/14 127 lb 3.3 oz (57.7 kg)  11/08/14 143 lb (64.864 kg)  09/02/14 141 lb 12.1 oz (64.3 kg)  01/03/14 145 lb 9.6 oz (66.044 kg)  08/04/13 149 lb 0.5 oz (67.6 kg)  08/31/12 139 lb 8.8 oz (63.3 kg)    BMI:  Body mass index is 21.89 kg/(m^2).  Estimated Nutritional Needs:  Kcal:  1800-2000  Protein:  90-100 grams  Fluid:  1.8-2.0 L  Skin:  Wound (see comment) (open rt thigh incision, unstageable sacral wound,  closed head incision)  Diet Order:   NPO  EDUCATION NEEDS:  No education needs identified at this time   Intake/Output Summary (Last 24 hours) at 03/27/15 1524 Last data filed at 03/27/15 1400  Gross per 24  hour  Intake    280 ml  Output    450 ml  Net   -170 ml    Last BM:  5/9  Pryor Ochoa RD, LDN Inpatient Clinical Dietitian Pager: [phone] After Hours Pager: [phone]

## 2015-03-27 NOTE — Anesthesia Postprocedure Evaluation (Signed)
  Anesthesia Post-op Note  Patient: William [name]  Procedure(s) Performed: Procedure(s): SHUNT INSERTION VENTRICULAR-PERITONEAL (N/A)  Patient Location: PACU  Anesthesia Type:General  Level of Consciousness: sedated and patient cooperative  Airway and Oxygen Therapy: Patient Spontanous Breathing  Post-op Pain: none  Post-op Assessment: Post-op Vital signs reviewed, Patient's Cardiovascular Status Stable, Respiratory Function Stable, Patent Airway, No signs of Nausea or vomiting and Pain level controlled  Post-op Vital Signs: stable  Last Vitals:  Filed Vitals:   03/27/15 0702  BP: 124/68  Pulse: 89  Temp: 37.3 C  Resp: 20    Complications: No apparent anesthesia complications

## 2015-03-27 NOTE — Progress Notes (Signed)
Patient ID: William [name], male   DOB: May 28, 1934, 81 y.o.   MRN: [id number] BP 133/69 mmHg  [name] 87  Temp(Src) 98 F (36.7 C) (Oral)  Resp 20  Ht 5\' 2"  (1.575 m)  Wt 54.3 kg (119 lb 11.4 oz)  BMI 21.89 kg/m2  SpO2 98% Alert, did speak Moving all extremities well  Surgical Wounds are all clean, dry, without signs of infection Will obtain CT today for new baseline.

## 2015-03-27 NOTE — Consult Note (Addendum)
WOC wound consult note Reason for Consult: Consult requested for sacrum wound.  Pt is familiar to [facility] team from previous admission. 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 Pressure Ulcer POA: Yes Measurement: 1.5X.5X.1cm with undermining to wound edges of .5cm Wound bed: Beefy red wound bed with bone palpable with swab. Drainage (amount, consistency, odor) Mod amt yellow drainage, no odor Periwound: Intact skin surrounding Dressing procedure/placement/frequency: Alginate packing to absorb drainage and promote healing.  It is difficult to keep wound from becoming soiled R/T the close proximity near the rectum and patient is frequently incontinent of stool. Left thigh with healing full thickness burn. Most of the surrounding area is pink dry scar tissue.  Patchy areas of partial thickness wounds are red and moist, affected area 4X4cm, all with depth approx .1cm, bleeds slightly when touched.  No other odor or drainage.  Foam dressing to protect and promote healing. No family members present to discuss plan of care. Please re-consult if further assistance is needed.  Thank-you,  Julien Girt MSN, RN, [place], [place], [place]

## 2015-03-28 NOTE — Progress Notes (Signed)
Patient ID: William [name], male   DOB: 01-Mar-1934, 81 y.o.   MRN: [id number] BP 110/60 mmHg  Pulse 78  Temp(Src) 97.9 F (36.6 C) (Oral)  Resp 18  Ht 5\' 2"  (1.575 m)  Wt 54.3 kg (119 lb 11.4 oz)  BMI 21.89 kg/m2  SpO2 98% William [name] is medically stable. CT looks good, catheter is in good position He does speak to me. Moving extremities appropriately.  Wounds are clean, dry, no signs of infection. May turn valve down to 100 on first post op if no significant change.

## 2015-03-28 NOTE — Clinical Social Work Note (Signed)
Clinical Social Work Assessment  Patient Details  Name: William [name] MRN: [id number] Date of Birth: [birth year]  Date of referral:  03/28/15               Reason for consult:  Facility Placement               Housing/Transportation Living arrangements for the past 2 months:  [place] of Information:  Adult Children [place] [phone]) Patient Interpreter Needed:  [place] Criminal Activity/Legal Involvement Pertinent to Current Situation/Hospitalization:  No - Comment as needed Significant Relationships:  Adult Children Lives with:  Facility Resident Do you feel safe going back to the place where you live?  Yes Need for family participation in patient care:  Yes (Comment)  Care giving concerns:  None    Social Worker assessment / plan:  CSW spoke with the pt's Jae at the bedside. CSW introduced self and purpose of the visit. Jae confirmed the pt was receiving rehab from [facility]. CSW and Leigh Aurora discussed the pt return back to [facility]. Jae reported that all paperwork will be signed by his sister Delana Meyer. CSW answered all questions in which the Jae inquired about. CSW provided Jae with contact information for further questions. CSW will continue to follow this pt and assist with discharge as needed.   Insurance information:  Medicare PT Recommendations:  [place]  Patient/Family's Response to care:  Jae reported that he wish the pt will not have a lengthy hospital stay and prefers to get the pt back to [facility].   Patient/Family's Understanding of and Emotional Response to Diagnosis, Current Treatment, and Prognosis:  Jae declined to dicuss the pt's condition.    Emotional Assessment Attitude/Demeanor/Rapport:  Unable to Assess Affect (typically observed):  Unable to Assess Orientation:  Oriented to Self Psych involvement (Current and /or in the community):  No (Comment)  Discharge Needs  Concerns to be addressed:  Denies Needs/Concerns  at this time Barriers to Discharge:  No Barriers Identified   [place], MSW, [place]

## 2015-03-29 NOTE — Discharge Instructions (Signed)
Ventriculoperitoneal Shunt Placement, Care After Refer to this sheet in the next few weeks. These instructions provide you with information on caring for yourself after your procedure. Your caregiver may also give you more specific instructions. Your treatment has been planned according to current medical practices, but problems sometimes occur. Call your caregiver if you have any problems or questions after your procedure. HOME CARE INSTRUCTIONS  For 2 weeks, do not soak the incision site under water. Swimming and baths should be avoided. Showers are okay, but rinse off the incision sites.  Do not overexert yourself. Your system needs time to adjust to the ventriculoperitoneal (VP) shunt.  If you have a programmable shunt and need a magnetic resonance imaging (MRI) test, it is very important that you see your surgeon to have your shunt reprogrammed immediately. Many programmable shunts are sensitive to magnets. SEEK IMMEDIATE MEDICAL CARE IF:  There is any drainage from the incision site.  The incision site starts to get red.  The incision site opens up.  Your symptoms return. The shunt may have gotten obstructed and may not be working.  You have any signs or symptoms of shunt malfunction. In an adult, this may include:  Headaches.  Nausea and vomiting.  Fever.  Swelling along the VP shunt path.  Vision changes.  Your child has any signs or symptoms of shunt malfunction. In an infant, this may include:  A swollen or raised soft spot.  Fussiness, irritability, or a high-pitched cry.  Feeding problems.  The head getting bigger. This may occur in very young babies. MAKE SURE YOU:  Understand these instructions.  Will watch your condition.  Will get help right away if you are not doing well or get worse. Document Released: 04/02/2011 Document Revised: 08/25/2013 Document Reviewed: 04/02/2011 [facility] Patient Information 2015 [place], [place]. This information is not  intended to replace advice given to you by your health care provider. Make sure you discuss any questions you have with your health care provider.

## 2015-03-29 NOTE — Progress Notes (Signed)
Patient ID: BUEFORD ARP, male   DOB: 05-21-1934, 81 y.o.   MRN: [id number] BP 108/62 mmHg  Pulse 81  Temp(Src) 98.5 F (36.9 C) (Axillary)  Resp 20  Ht 5\' 2"  (1.575 m)  Wt 54.3 kg (119 lb 11.4 oz)  BMI 21.89 kg/m2  SpO2 98% At [place], will open eyes to voice. Not following commands Moving all extremities.  Will be discharged to snf tomorrow. kc

## 2015-03-29 NOTE — Discharge Summary (Signed)
Physician Discharge Summary  Patient ID: William [name] MRN: [id number] DOB/AGE: 25-Nov-1933 81 y.o.  Admit date: 03/24/2015 Discharge date: 03/29/2015  Admission Diagnoses:Hydrocephalus  Discharge Diagnoses:  Active Problems:   Hydrocephalus   Malnutrition of moderate degree   Discharged Condition: fair  Hospital Course: [name] was admitted and taken to the operating room for an uncomplicated placement of a Left ventricular peritoneal shunt. The valve pressure was set to 131mm H2O, codman hakim programmable valve. He has done well post op, he does try to speak with me. Post op ct scan shows the ventricular catheter in good position. Wounds are clean, dry, and without signs of infection. At baseline he will move all extremities, will track, occasionally will try to speak. He does not speak english.   Treatments: surgery: as above  Discharge Exam: Blood pressure 108/62, pulse 81, temperature 98.5 F (36.9 C), temperature source Axillary, resp. rate 20, height 5\' 2"  (1.575 m), weight 54.3 kg (119 lb 11.4 oz), SpO2 98 %. as above.  Disposition: 03-Skilled Nursing Facility benign neoplasm of cerebral meninges, hydrocephalus    Medication List    TAKE these medications        acetaminophen-codeine 300-30 MG per tablet  Commonly known as:  TYLENOL #3  Take 1 tablet by mouth every 6 (six) hours as needed for moderate pain.     acidophilus Caps capsule  1 capsule by PEG Tube route 2 (two) times daily.     amLODipine 10 MG tablet  Commonly known as:  NORVASC  10 mg by PEG Tube route daily.     calcium carbonate 1250 (500 CA) MG tablet  Commonly known as:  OS-CAL - dosed in mg of elemental calcium  1 tablet by PEG Tube route daily.     CERTAVITE/ANTIOXIDANTS Liqd  10 mLs by PEG Tube route daily.     chlorhexidine 0.12 % solution  Commonly known as:  PERIDEX  Use as directed 15 mLs in the mouth or throat 3 (three) times daily.     diphenhydrAMINE 12.5 MG/5ML liquid  Commonly  known as:  BENADRYL  25 mg by PEG Tube route every 6 (six) hours as needed for itching.     docusate 50 MG/5ML liquid  Commonly known as:  COLACE  100 mg by PEG Tube route 2 (two) times daily.     feeding supplement (JEVITY 1.5 CAL) Liqd  by PEG Tube route See admin instructions. 60cc/hour for 18 hours - start 2pm, off 8am     ferrous sulfate 220 (44 FE) MG/5ML solution  330 mg by PEG Tube route daily.     levETIRAcetam 100 MG/ML solution  Commonly known as:  KEPPRA  500 mg by PEG Tube route 2 (two) times daily. 9am, 9pm     lisinopril 40 MG tablet  Commonly known as:  PRINIVIL,ZESTRIL  40 mg by PEG Tube route daily.     pravastatin 20 MG tablet  Commonly known as:  PRAVACHOL  20 mg by PEG Tube route daily.     PROSOURCE PO  30 mLs by PEG Tube route 2 (two) times daily.     senna 8.6 MG Tabs tablet  Commonly known as:  SENOKOT  2 tablets by PEG Tube route 2 (two) times daily.     vitamin C 500 MG tablet  Commonly known as:  ASCORBIC ACID  1,000 mg by PEG Tube route daily.           Follow-up Information    Follow  up with Brooks Kinnan L, MD In 1 week.   Specialty:  Neurosurgery   Why:  for staple removal   Contact information:   [address] [place] [address] [phone]       Signed: Winfield Cunas 03/29/2015, 9:23 PM

## 2015-03-30 NOTE — Clinical Social Work Note (Signed)
CSW spoke with pt's son Ramari Bray [phone].  CSW informed Jae that the pt will be discharge back to [facility] today. CSW and Jae discussed ambulance transport.  CSW contact [facility] at [phone] to schedule transport for the pt. CSW informed Melissa at [facility] regarding the pt return. CSW upload the pt's discharge summary. Bedside RN can call reported to [phone].   [place], MSW, [place]

## 2015-03-30 NOTE — Progress Notes (Signed)
Patient ID: William [name], male   DOB: 08-03-1934, 81 y.o.   MRN: [id number] Visited on request of family/daughter with concern regarding d/c from hospital after she witnessed "seizures" yesterday.  Spoke with daughter & patient (through interpretation by daughter) at length regarding pt's current status, his surgery, and his pre-op status.  Pt attends throughout our bedside conversation, reaching appropriately when offered handshake, smiling in response to smiles of nurse & daughter. He states name, DOB, and location to daughter correctly when questioned. PEARL. MAEW. Follows commands all extremities. Daughter reports pt has been bedbound x53mos. Some tremor observed with grasping hands and raising feet from mattress. Daughter remarks same movement yesterday "but all over". She notes pt remained awake and attentive throughout episodes yesterday.  Daughter asks about need for a helmet and if he needs care for seizures. Discussed V.P. Shunt purpose, placement technique, and precautions. No helmet needed for burr hole with cap.  Also discussed tremors and seizures. This does not appear to have been seizure activity.   Incisions with staples at scalp, dermabond right side abdomen. Both sites without erythema, swelling, or drainage. Belly soft, without grimace or withdrawal to gentle palpation.   Reassured daughter that DrStern will delay d/c until tomorrow, & let him return to SNF tomorrow if he has no problems.   Verdis Prime, RN, BSN

## 2015-03-31 NOTE — Progress Notes (Signed)
Patient is being d/c to skilled nursing facility, report called to the receiving nurse Mardene Celeste.

## 2015-03-31 NOTE — Clinical Social Work Note (Signed)
CSW spoke with pt's son Hikaru Delorenzo [phone].Jae asked if the pt can stay another day. CSW explained the discharge process. Jae reported that he will notify his mother and sister. CSW informed Jae that the pt will be discharge back to [facility] today. CSW and Jae discussed ambulance transport. CSW contact [facility] at [phone] to schedule transport for the pt. CSW informed Melissa at [facility] regarding the pt return. CSW upload the pt's discharge summary. Bedside RN can call reported to [phone].   [place], MSW, [place]

## 2015-03-31 NOTE — Discharge Summary (Cosign Needed)
Physician Discharge Summary  Patient ID: William [name] MRN: [id number] DOB/AGE: 1934-11-13 81 y.o.  Admit date: 03/24/2015 Discharge date: 03/31/2015  Admission Diagnoses:benign neoplasm of cerebral meninges, hydrocephalus   Discharge Diagnoses: benign neoplasm of cerebral meninges, hydrocephalus s/p Left Frontal ventriculoperitoneal shunt creation Codman-Hakim programmable valve set at 16mmH2O  Active Problems:   Hydrocephalus   Malnutrition of moderate degree   Discharged Condition: fair  Hospital Course: (Dr. Christella Noa: William [name] was admitted and taken to the operating room for an uncomplicated placement of a Left ventricular peritoneal shunt. The valve pressure was set to 16mm H2O, codman hakim programmable valve. He has done well post op, he does try to speak with me. Post op ct scan shows the ventricular catheter in good position. Wounds are clean, dry, and without signs of infection. At baseline he will move all extremities, will track, occasionally will try to speak. He does not speak english.) Discharge was delayed 24hrs due to concerns voiced by family of seizure activity. Monitoring revealed no further cause for concern, and discharge will proceed today.     Consults: None  Significant Diagnostic Studies:   Treatments: surgery: Left Frontal ventriculoperitoneal shunt creation Codman-Hakim programmable valve set at 116mmH2O   Discharge Exam: Blood pressure 126/56, pulse 74, temperature 97.8 F (36.6 C), temperature source Axillary, resp. rate 18, height 5\' 2"  (1.575 m), weight 54.3 kg (119 lb 11.4 oz), SpO2 98 %. Awakens to voice. (Daughter is not present to translate.) No evidence of discomfort (grimace/frown/withdrawal) Attends and allows examination of abdomen. Scalp incision with stables, no erythema, swelling, or drainage. Abdominal incision with Dermabond, no erythema, swelling, or drainage. Belly soft, nondistended. No evidence of discomfort with gentle palpation. Nursing  reports no issues overnight.    Disposition: 03-Skilled Nursing Facility     Medication List    TAKE these medications        acetaminophen-codeine 300-30 MG per tablet  Commonly known as:  TYLENOL #3  Take 1 tablet by mouth every 6 (six) hours as needed for moderate pain.     acidophilus Caps capsule  1 capsule by PEG Tube route 2 (two) times daily.     amLODipine 10 MG tablet  Commonly known as:  NORVASC  10 mg by PEG Tube route daily.     calcium carbonate 1250 (500 CA) MG tablet  Commonly known as:  OS-CAL - dosed in mg of elemental calcium  1 tablet by PEG Tube route daily.     CERTAVITE/ANTIOXIDANTS Liqd  10 mLs by PEG Tube route daily.     chlorhexidine 0.12 % solution  Commonly known as:  PERIDEX  Use as directed 15 mLs in the mouth or throat 3 (three) times daily.     diphenhydrAMINE 12.5 MG/5ML liquid  Commonly known as:  BENADRYL  25 mg by PEG Tube route every 6 (six) hours as needed for itching.     docusate 50 MG/5ML liquid  Commonly known as:  COLACE  100 mg by PEG Tube route 2 (two) times daily.     feeding supplement (JEVITY 1.5 CAL) Liqd  by PEG Tube route See admin instructions. 60cc/hour for 18 hours - start 2pm, off 8am     ferrous sulfate 220 (44 FE) MG/5ML solution  330 mg by PEG Tube route daily.     levETIRAcetam 100 MG/ML solution  Commonly known as:  KEPPRA  500 mg by PEG Tube route 2 (two) times daily. 9am, 9pm     lisinopril 40 MG tablet  Commonly known as:  PRINIVIL,ZESTRIL  40 mg by PEG Tube route daily.     pravastatin 20 MG tablet  Commonly known as:  PRAVACHOL  20 mg by PEG Tube route daily.     PROSOURCE PO  30 mLs by PEG Tube route 2 (two) times daily.     senna 8.6 MG Tabs tablet  Commonly known as:  SENOKOT  2 tablets by PEG Tube route 2 (two) times daily.     vitamin C 500 MG tablet  Commonly known as:  ASCORBIC ACID  1,000 mg by PEG Tube route daily.           Follow-up Information    Follow up with  CABBELL,KYLE L, MD In 1 week.   Specialty:  Neurosurgery   Why:  for staple removal   Contact information:   [address] [place] [address] [place] [address] [phone]       Signed: Verdis Prime 03/31/2015, 8:30 AM

## 2015-03-31 NOTE — Progress Notes (Signed)
Patient left unit with [facility].

## 2015-03-31 NOTE — Progress Notes (Signed)
Patient ID: William [name], male   DOB: Apr 07, 1934, 81 y.o.   MRN: [id number] Discharge by dr Ellene Route

## 2015-03-31 NOTE — Progress Notes (Signed)
Subjective: Patient reports (does not speak [place])  Objective: Vital signs in last 24 hours: Temp:  [97.8 F (36.6 C)-98.6 F (37 C)] 97.8 F (36.6 C) (05/13 0619) Pulse Rate:  [74-98] 74 (05/13 0619) Resp:  [16-20] 18 (05/13 0619) BP: (108-134)/(54-65) 126/56 mmHg (05/13 0619) SpO2:  [98 %-99 %] 98 % (05/13 0619)  Intake/Output from previous day: 05/12 0701 - 05/13 0700 In: -  Out: 2200 [Urine:2200] Intake/Output this shift:    Awakens to voice. (Daughter is not present to translate.) No evidence of discomfort (grimace/frown/withdrawal) Attends and allows examination of abdomen. Scalp incision with stables, no erythema, swelling, or drainage. Abdominal incision with Dermabond, no erythema, swelling, or drainage. Belly soft, nondistended. No evidence of discomfort with gentle palpation. Nursing reports no issues overnight.   Lab Results: No results for input(s): WBC, HGB, HCT, PLT in the last 72 hours. BMET No results for input(s): NA, K, CL, CO2, GLUCOSE, BUN, CREATININE, CALCIUM in the last 72 hours.  Studies/Results: No results found.  Assessment/Plan: Stable/baseline   LOS: 7 days  Per DrStern, d/c to SNF.    Verdis Prime 03/31/2015, 8:24 AM

## 2015-05-05 ENCOUNTER — Other Ambulatory Visit: Payer: Self-pay | Admitting: Neurosurgery

## 2015-05-05 DIAGNOSIS — D320 Benign neoplasm of cerebral meninges: Secondary | ICD-10-CM

## 2015-05-11 ENCOUNTER — Ambulatory Visit
Admission: RE | Admit: 2015-05-11 | Discharge: 2015-05-11 | Disposition: A | Discharge status: Home / Self Care | Payer: Medicare Other | Source: Ambulatory Visit | Attending: Neurosurgery | Admitting: Neurosurgery

## 2015-05-11 DIAGNOSIS — D320 Benign neoplasm of cerebral meninges: Secondary | ICD-10-CM

## 2015-08-14 ENCOUNTER — Other Ambulatory Visit: Payer: Self-pay | Admitting: Neurosurgery

## 2015-08-14 DIAGNOSIS — D320 Benign neoplasm of cerebral meninges: Secondary | ICD-10-CM

## 2015-09-19 DEATH — deceased

## 2015-12-28 ENCOUNTER — Encounter: Payer: Self-pay | Admitting: Radiation Therapy

## 2016-04-01 NOTE — Addendum Note (Signed)
Addendum  created 04/01/16 0819 by Izora Gala, CRNA   Modules edited: Notes Section   Notes Section:  Delete: [id number]

## 2016-10-30 IMAGING — CT CT HEAD WO/W CM
3 of 4 series · 16 of 30 positions shown, 18 images · IV contrast (75CC OMNI 300)
Comparison: Brain MRI 07/06/2014 and earlier.

ADDENDUM:
Study discussed by telephone with Dr. Yoshua Olmstead (on-call for Dr.
COUMAREN UMARKHAN ) on 10/12/2014 at 8088 hrs.
CLINICAL DATA: 80-year-old male with current history of recurrent
meningioma status post surgical resections and radiation therapy.
Most recent resection 09/02/2014. Subsequent encounter.

EXAM:
CT HEAD WITHOUT AND WITH CONTRAST
TECHNIQUE: Contiguous axial images were obtained from the base of the skull
through the vertex without and with intravenous contrast
CONTRAST:  75 mL Omnipaque 300.

[Series 3: head bone · axial · 0.49mm/px · z∈[+33,+113]mm · 4 of 32 slices shown]
[im 6/32  bone]
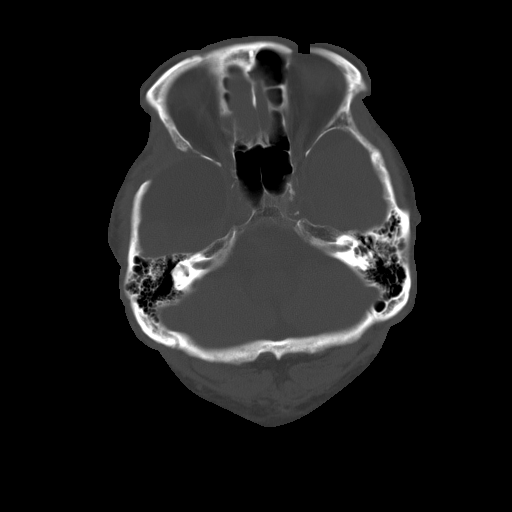
[im 11/32  bone]
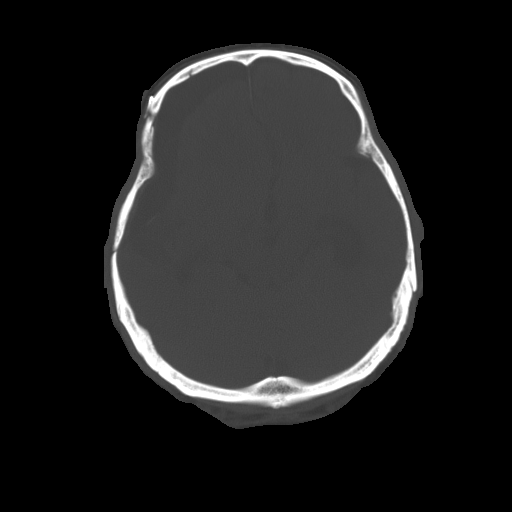
[im 16/32  bone]
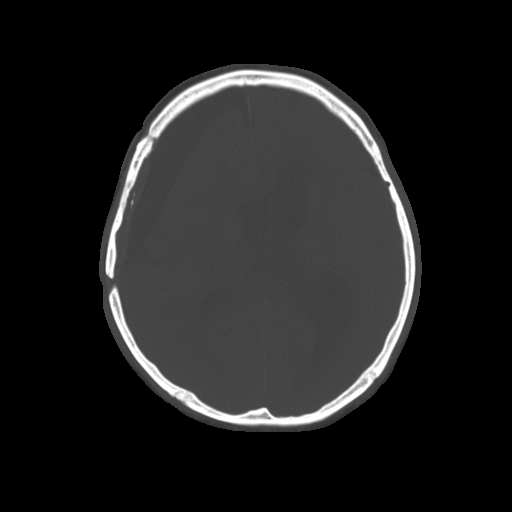
[im 21/32  bone]
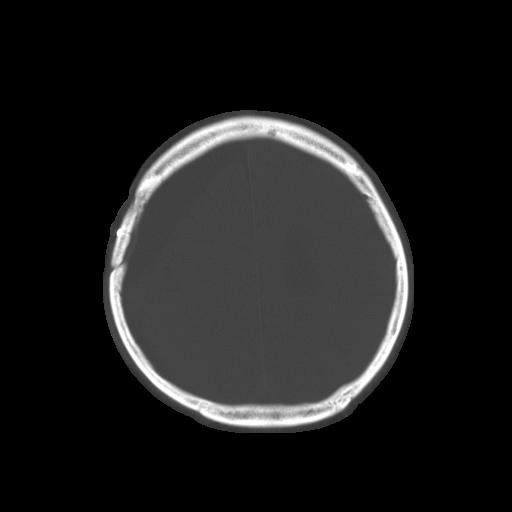

[Series 32: 3d filtered head w/o · axial · non-contrast · 0.49mm/px · z∈[+28,+145]mm · 6 of 32 slices shown, 8 images (1 of 2)]
[im 5/32  brain]
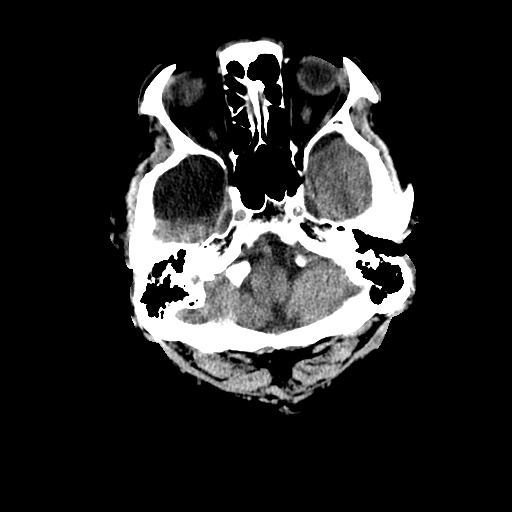
[im 5/32  bone]
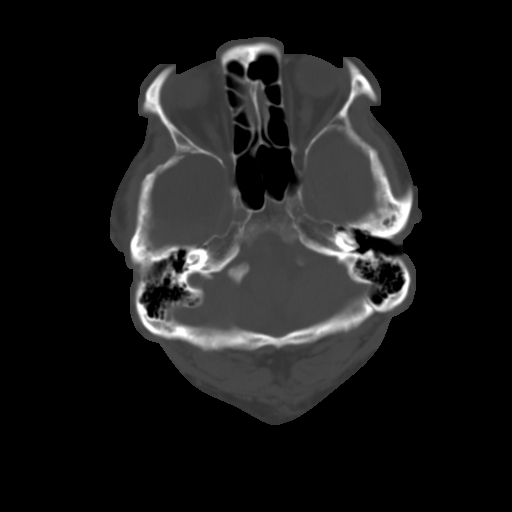
[im 9/32  brain]
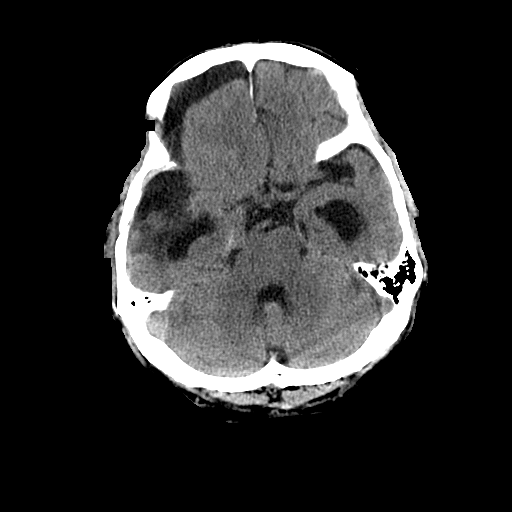
[im 14/32  brain]
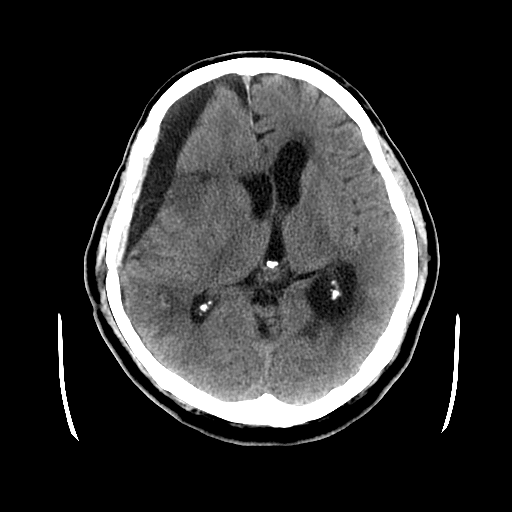
[im 18/32  brain]
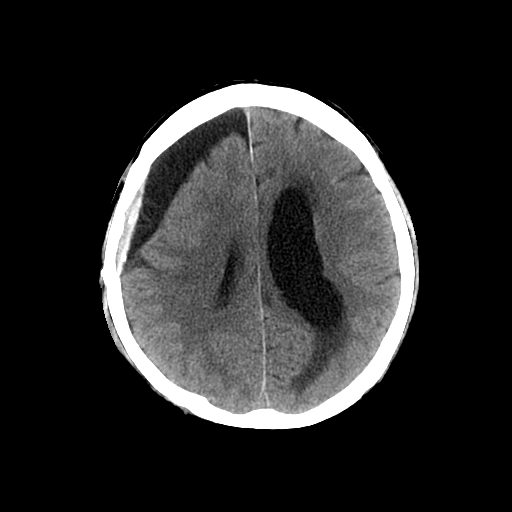
[im 23/32  brain]
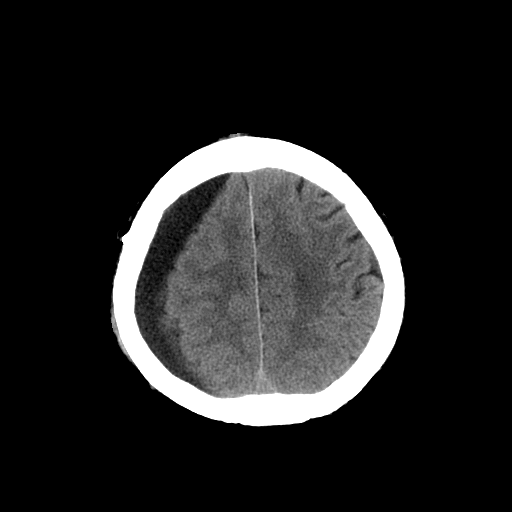
[im 23/32  bone]
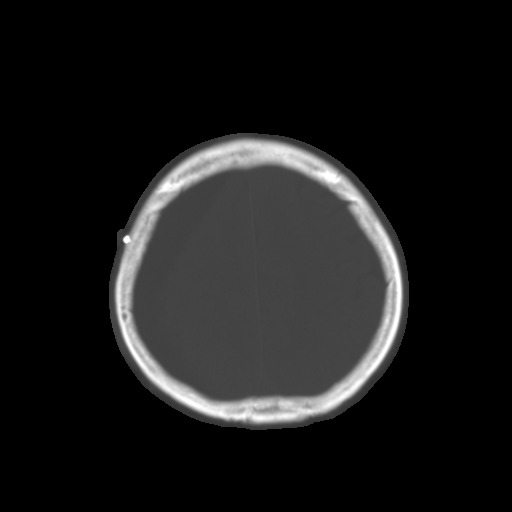
[im 27/32  brain]
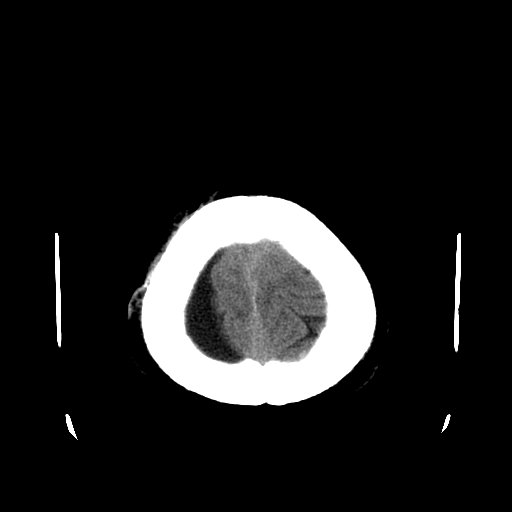

[Series 34: 3d filtered head w/o · axial · non-contrast · 0.49mm/px · z∈[+20,+135]mm · 6 of 32 slices shown (2 of 2)]
[im 5/32  brain]
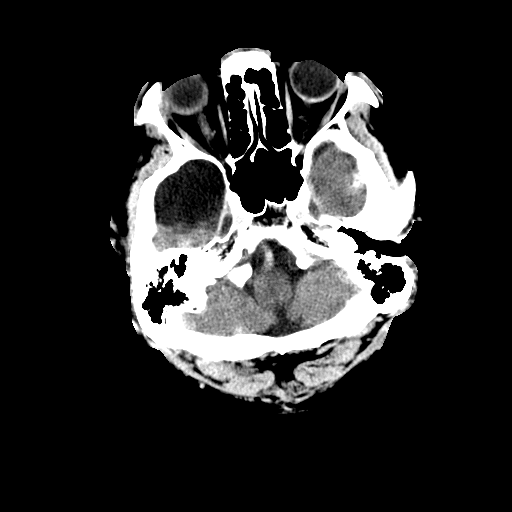
[im 9/32  brain]
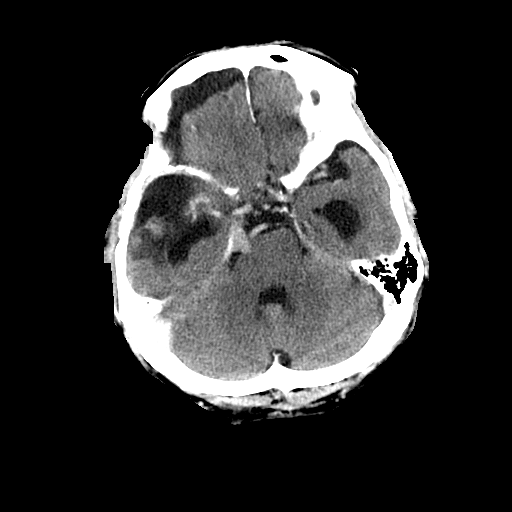
[im 14/32  brain]
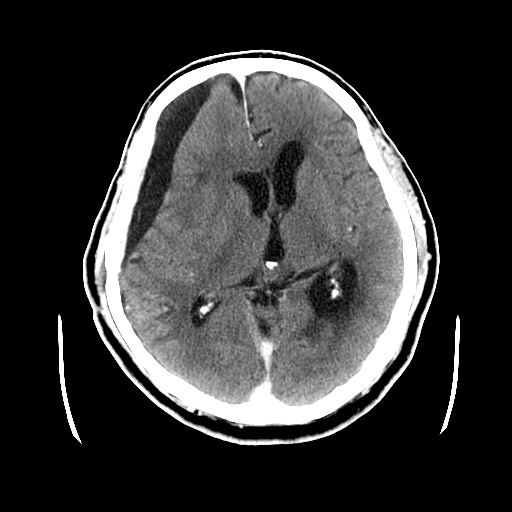
[im 18/32  brain]
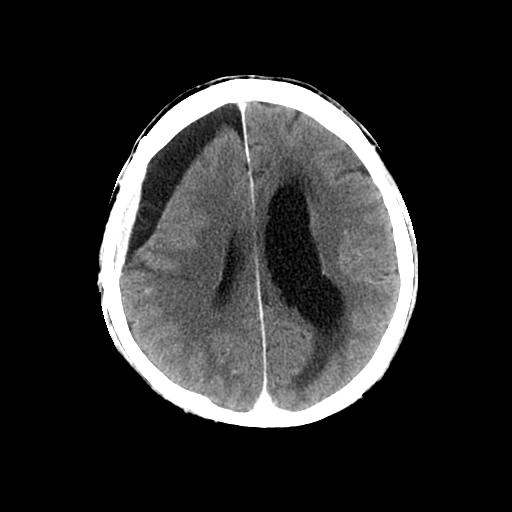
[im 23/32  brain]
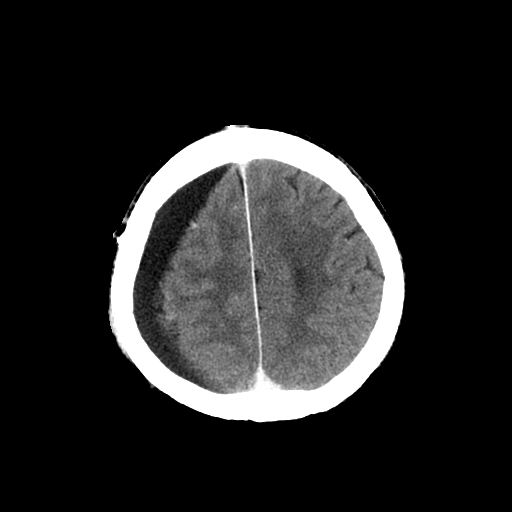
[im 27/32  brain]
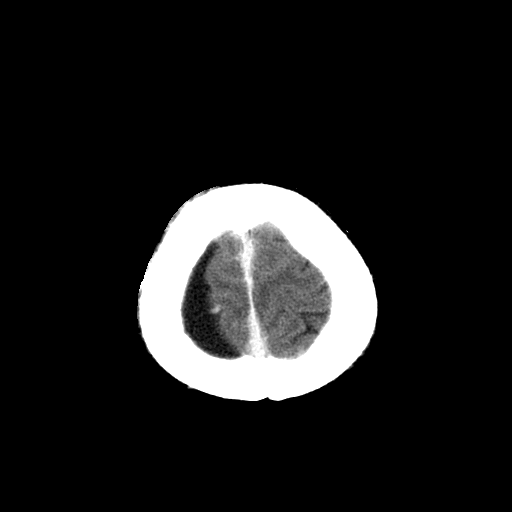

[16 of 30 positions shown; findings below may reference images not displayed]

FINDINGS: Sequelae of right frontotemporal craniotomy. No acute osseous
abnormality identified. Visualized paranasal sinuses and mastoids
are clear. No acute orbit or scalp soft tissue findings identified.
Calcified atherosclerosis at the skull base.

Regressed lobular enhancing mass centered at the right sphenoid
wing. Small triangular enhancing soft tissue area encompassing 10 x
13 mm identified on series 34, image 9.

Chronic postoperative changes to the right anterior temporal tip
with right middle cranial fossa CSF. However, there is a new mostly
CSF density extra-axial collection in the anterior cranial fossa and
tracking along the anterior right hemisphere measuring 9-13 mm in
thickness.

Associated new leftward midline shift of 9 mm. Mass effect on the
right lateral ventricle. The left temporal and occipital horns
appear mildly enlarged with suggestion of increased periventricular
hypodensity in those regions. Basilar cisterns remain patent.
Chronic right operculum encephalomalacia appears stable. Hyperdense
dural, probably dural repair thickening underlying the craniotomy
site. No other abnormal intracranial enhancement identified. No
evidence of cortically based acute infarction identified. Major
intracranial vascular structures are enhancing.
IMPRESSION: 1. New mostly CSF density extra-axial collection along the right
hemisphere measuring up to 13 mm in thickness. Associated new
leftward midline shift of 9 mm, and evidence of some trapping of the
left lateral ventricle.
2. Significant debulking of right sphenoid recurrent meningioma.
Small triangular 10 x 13 mm area of enhancement persists on series
34, image 9.
3. Chronic postoperative changes to the right temporal lobe and
middle cranial fossa.
